# Patient Record
Sex: Male | Born: 1940 | Race: White | Hispanic: No | Marital: Married | Smoking: Never smoker
Health system: Southern US, Community
[De-identification: ages and names within clinical notes are randomized; demographics above are authoritative.]

## PROBLEM LIST (undated history)

## (undated) DIAGNOSIS — Z8619 Personal history of other infectious and parasitic diseases: Secondary | ICD-10-CM

## (undated) DIAGNOSIS — I700 Atherosclerosis of aorta: Secondary | ICD-10-CM

## (undated) DIAGNOSIS — E538 Deficiency of other specified B group vitamins: Secondary | ICD-10-CM

## (undated) DIAGNOSIS — H442B9 Degenerative myopia with macular hole, unspecified eye: Secondary | ICD-10-CM

## (undated) DIAGNOSIS — I10 Essential (primary) hypertension: Secondary | ICD-10-CM

## (undated) DIAGNOSIS — K8689 Other specified diseases of pancreas: Secondary | ICD-10-CM

## (undated) DIAGNOSIS — N400 Enlarged prostate without lower urinary tract symptoms: Secondary | ICD-10-CM

## (undated) DIAGNOSIS — E785 Hyperlipidemia, unspecified: Secondary | ICD-10-CM

## (undated) DIAGNOSIS — M1990 Unspecified osteoarthritis, unspecified site: Secondary | ICD-10-CM

## (undated) DIAGNOSIS — Z974 Presence of external hearing-aid: Secondary | ICD-10-CM

## (undated) DIAGNOSIS — G5600 Carpal tunnel syndrome, unspecified upper limb: Secondary | ICD-10-CM

## (undated) HISTORY — PX: HERNIA REPAIR: SHX51

## (undated) HISTORY — PX: CATARACT EXTRACTION: SUR2

## (undated) HISTORY — PX: GI PROSTATE BIOPSY: SUR644

---

## 2007-02-07 ENCOUNTER — Ambulatory Visit: Payer: Self-pay | Admitting: Gastroenterology

## 2011-08-29 ENCOUNTER — Ambulatory Visit: Payer: Self-pay | Admitting: Surgery

## 2012-03-03 DIAGNOSIS — R339 Retention of urine, unspecified: Secondary | ICD-10-CM | POA: Insufficient documentation

## 2012-03-03 DIAGNOSIS — R399 Unspecified symptoms and signs involving the genitourinary system: Secondary | ICD-10-CM | POA: Insufficient documentation

## 2012-03-03 DIAGNOSIS — R3911 Hesitancy of micturition: Secondary | ICD-10-CM | POA: Insufficient documentation

## 2012-03-03 DIAGNOSIS — R972 Elevated prostate specific antigen [PSA]: Secondary | ICD-10-CM | POA: Insufficient documentation

## 2012-03-03 DIAGNOSIS — K4090 Unilateral inguinal hernia, without obstruction or gangrene, not specified as recurrent: Secondary | ICD-10-CM | POA: Insufficient documentation

## 2012-03-03 DIAGNOSIS — N401 Enlarged prostate with lower urinary tract symptoms: Secondary | ICD-10-CM | POA: Insufficient documentation

## 2012-03-03 DIAGNOSIS — N529 Male erectile dysfunction, unspecified: Secondary | ICD-10-CM | POA: Insufficient documentation

## 2012-03-03 DIAGNOSIS — R6882 Decreased libido: Secondary | ICD-10-CM | POA: Insufficient documentation

## 2012-03-03 DIAGNOSIS — N411 Chronic prostatitis: Secondary | ICD-10-CM | POA: Insufficient documentation

## 2013-08-19 DIAGNOSIS — I10 Essential (primary) hypertension: Secondary | ICD-10-CM | POA: Insufficient documentation

## 2013-11-21 ENCOUNTER — Emergency Department: Payer: Self-pay | Admitting: Emergency Medicine

## 2014-09-05 NOTE — Op Note (Signed)
PATIENT NAME:  Randall Cole, Randall Cole MR#:  [id number] DATE OF BIRTH:  1940/06/19  DATE OF PROCEDURE:  08/29/2011  PREOPERATIVE DIAGNOSIS: Right inguinal hernia.   POSTOPERATIVE DIAGNOSIS: Right inguinal hernia.   PROCEDURE: Right inguinal hernia repair.   SURGEON: Rochel Brome, MD   ANESTHESIA: General.   INDICATIONS: This 74 year old male has recently been having bulging in the right groin. He was doing some strenuous activities about five weeks prior to presentation. He had some stinging sensation and subsequent minimal discomfort. A right inguinal hernia was demonstrated on physical exam and repair was recommended for definitive treatment.   DESCRIPTION OF PROCEDURE: The patient was placed on the operating table in the supine position under general anesthesia. The right groin was prepared with clippers and with ChloraPrep and draped in a sterile manner.   A right lower quadrant transversely oriented suprapubic incision was made and carried down through subcutaneous tissues. Several small bleeding points were cauterized. Scarpa's fascia was incised. The external oblique aponeurosis was incised along the course of its fibers to open the external ring and expose the inguinal cord structures. These structures were mobilized. A Penrose drain was passed around the cord structures. There was a direct inguinal hernia which consisted of markedly splayed fascia with the defect in the fascia. Next, the cremaster fibers were spread to expose an indirect hernia sac which was approximately 2.5 inches in length. The sac was dissected free from surrounding structures and followed up into the internal ring.  It was opened. Its continuity with the peritoneal cavity was demonstrated. A high ligation of the sac was done with 4-0 Vicryl suture ligature. The sac was excised. The stump was allowed to retract. It did not need pathological examination. Next, the floor of the inguinal canal was repaired with a row of 0 Surgilon  sutures beginning at the pubic tubercle, suturing the conjoined tendon to the shelving edge of the inguinal ligament, incorporating transversalis fascia into the repair. The last stitch led to satisfactory narrowing of the internal ring. Next, an onlay Atrium mesh was cut to create an oval shape of some 2.5 x 4 cm and was placed over the repair and was sutured to the repair with 0 Surgilon. It also was sutured medially to the fascia. A notch was cut out for the cord structures and straddled the cord structures, then sewn to the deep fascia. The repair looked good. Hemostasis was intact. Cord structures were replaced along the floor of the inguinal canal. The cut edges of the external oblique aponeurosis were closed with a running 4-0 Vicryl. The fascia superior and lateral to the repair site was infiltrated with 0.5% Sensorcaine with epinephrine. The subcuticular  tissues were infiltrated as well. The Scarpa's fascia was closed with interrupted 4-0 Vicryl. The skin was closed with running 5-0 Monocryl subcuticular suture and Dermabond. The testicle remained in the scrotum. The patient tolerated the procedure satisfactorily and was then prepared for transfer to the recovery room.    ____________________________ Lenna Sciara. Rochel Brome, MD [id number] D: 08/29/2011 08:50:25 ET T: 08/29/2011 10:51:31 ET JOB#: [id number]  cc: Loreli Dollar, MD, <Dictator> Loreli Dollar MD ELECTRONICALLY SIGNED 08/30/2011 13:03

## 2015-09-24 ENCOUNTER — Emergency Department: Payer: Commercial Managed Care - HMO

## 2015-09-24 ENCOUNTER — Encounter: Payer: Self-pay | Admitting: Emergency Medicine

## 2015-09-24 ENCOUNTER — Emergency Department
Admission: EM | Admit: 2015-09-24 | Discharge: 2015-09-24 | Disposition: A | Discharge status: Home / Self Care | Payer: Commercial Managed Care - HMO | Attending: Emergency Medicine | Admitting: Emergency Medicine

## 2015-09-24 DIAGNOSIS — R270 Ataxia, unspecified: Secondary | ICD-10-CM | POA: Diagnosis not present

## 2015-09-24 DIAGNOSIS — I10 Essential (primary) hypertension: Secondary | ICD-10-CM | POA: Insufficient documentation

## 2015-09-24 DIAGNOSIS — H6123 Impacted cerumen, bilateral: Secondary | ICD-10-CM | POA: Diagnosis present

## 2015-09-24 HISTORY — DX: Essential (primary) hypertension: I10

## 2015-09-24 LAB — CBC
HEMATOCRIT: 45.7 % (ref 40.0–52.0)
Hemoglobin: 15.9 g/dL (ref 13.0–18.0)
MCH: 32.6 pg (ref 26.0–34.0)
MCHC: 34.7 g/dL (ref 32.0–36.0)
MCV: 93.9 fL (ref 80.0–100.0)
PLATELETS: 224 10*3/uL (ref 150–440)
RBC: 4.87 MIL/uL (ref 4.40–5.90)
RDW: 13 % (ref 11.5–14.5)
WBC: 4.8 10*3/uL (ref 3.8–10.6)

## 2015-09-24 LAB — URINALYSIS COMPLETE WITH MICROSCOPIC (ARMC ONLY)
BILIRUBIN URINE: NEGATIVE
Bacteria, UA: NONE SEEN
GLUCOSE, UA: NEGATIVE mg/dL
HGB URINE DIPSTICK: NEGATIVE
KETONES UR: NEGATIVE mg/dL
LEUKOCYTES UA: NEGATIVE
Nitrite: NEGATIVE
PH: 6 (ref 5.0–8.0)
Protein, ur: NEGATIVE mg/dL
RBC / HPF: NONE SEEN RBC/hpf (ref 0–5)
Specific Gravity, Urine: 1.006 (ref 1.005–1.030)

## 2015-09-24 LAB — COMPREHENSIVE METABOLIC PANEL
ALT: 24 U/L (ref 17–63)
ANION GAP: 5 (ref 5–15)
AST: 26 U/L (ref 15–41)
Albumin: 4.2 g/dL (ref 3.5–5.0)
Alkaline Phosphatase: 65 U/L (ref 38–126)
BILIRUBIN TOTAL: 0.9 mg/dL (ref 0.3–1.2)
BUN: 15 mg/dL (ref 6–20)
CHLORIDE: 106 mmol/L (ref 101–111)
CO2: 29 mmol/L (ref 22–32)
Calcium: 9.3 mg/dL (ref 8.9–10.3)
Creatinine, Ser: 0.82 mg/dL (ref 0.61–1.24)
Glucose, Bld: 133 mg/dL — ABNORMAL HIGH (ref 65–99)
POTASSIUM: 4.5 mmol/L (ref 3.5–5.1)
Sodium: 140 mmol/L (ref 135–145)
TOTAL PROTEIN: 7.2 g/dL (ref 6.5–8.1)

## 2015-09-24 LAB — TROPONIN I

## 2015-09-24 MED ORDER — SODIUM CHLORIDE 0.9 % IV BOLUS (SEPSIS): 1000.0000 mL | Freq: Once | INTRAVENOUS | Status: DC

## 2015-09-24 MED ORDER — MECLIZINE HCL 25 MG PO TABS
25.0000 mg | ORAL_TABLET | Freq: Once | ORAL | Status: AC
  Administered 2015-09-24: 25 mg via ORAL
  Filled 2015-09-24: qty 1

## 2015-09-24 MED ORDER — MECLIZINE HCL 25 MG PO TABS: 25.0000 mg | ORAL_TABLET | Freq: Three times a day (TID) | ORAL | Status: DC | PRN

## 2015-09-24 NOTE — ED Notes (Signed)
Dr. Clearnce Hasten at bedside flushing patients ears. Per Dr. Clearnce Hasten hold off on IV fluids for now, patients heart rate has improved.

## 2015-09-24 NOTE — ED Notes (Signed)
Dizzy x 2 days. Denies chest pain or SOB. States usual heartrate in 50s.

## 2015-09-24 NOTE — Discharge Instructions (Signed)
Ataxia Ataxia is a condition that results in unsteadiness when walking and standing, poor coordination of body movements, and difficulty maintaining an upright posture. It occurs due to a problem with the part of your brain that controls coordination and stability (cerebellar dysfunction).  CAUSES  Ataxia can develop later in life (acquired ataxia) during your 20s to 30s, and even as late as into your 60s or beyond. Acquired ataxia may be caused by:  Changes in your nervous system (neurodegenerative).  Changes throughout your body (systemic disorders).  Excess exposure to:  Medicines, such as phenytoin and lithium.  Solvents.  Abuse of alcohol (alcoholism).  Medical conditions, such as:  Celiac sprue.  Hypothyroidism.  Vitamin E deficiency.  Structural brain abnormalities, such as tumors.  Multiple sclerosis.  Stroke.  Head injury. Ataxia may also be present early in life (non-acquired ataxia). There are two main types of non-acquired ataxia:  Cerebellar dysfunction present at birth (congenital).  Family inheritance (genetic heredity). Friedreich ataxia is the most common form of hereditary ataxia. SIGNS AND SYMPTOMS The signs and symptoms of ataxia can vary depending on how severe the condition is that causes it. Signs and symptoms may include:  Unsteadiness.  Walking with a wide stance.  Tremor.  Poorly coordinated body movements.  Difficulty maintaining a straight (upright) posture.  Fatigue.  Changes in your speech.  Changes in your vision.  Difficulty swallowing.  Difficulty with writing.  Decreased mental status (dementia).  Muscle spasms. DIAGNOSIS  Ataxia is diagnosed by discussing your personal and family history and through a physical exam. You may also have additional tests such as:  MRI.  Genetic testing. TREATMENT  Treatment for ataxia may include treating or removing the underlying condition causing the ataxia. Surgery may be  required if a structural abnormality in your brain is causing the ataxia. Otherwise, supportive treatments may be used to manage your symptoms. HOME CARE INSTRUCTIONS Monitor your ataxia for any changes. The following actions may help any discomfort you are experiencing:   Do not drink alcohol.  Lie down right away if you become very unsteady, dizzy, nauseated, or feel like you are going to faint. Wait until all of these feelings pass before you get up again. SEEK IMMEDIATE MEDICAL CARE IF:  Your unsteadiness suddenly worsens.  You develop severe headaches, chest pain, or abdominal pain.   You have weakness or numbness on one side of your body.   You have problems with your vision.   You feel confused.   You have difficulty speaking.   You have an irregular heartbeat or a very fast pulse.    This information is not intended to replace advice given to you by your health care provider. Make sure you discuss any questions you have with your health care provider.   Document Released: 11/25/2013 Document Reviewed: 11/25/2013 Elsevier Interactive Patient Education 2016 [place] Impaction The structures of the external ear canal secrete a waxy substance known as cerumen. Excess cerumen can build up in the ear canal, causing a condition known as cerumen impaction. Cerumen impaction can cause ear pain and disrupt the function of the ear. The rate of cerumen production differs for each individual. In certain individuals, the configuration of the ear canal may decrease his or her ability to naturally remove cerumen. CAUSES Cerumen impaction is caused by excessive cerumen production or buildup. RISK FACTORS  Frequent use of swabs to clean ears.  Having narrow ear canals.  Having eczema.  Being dehydrated. SIGNS AND SYMPTOMS  Diminished hearing.  Ear drainage.  Ear pain.  Ear itch. TREATMENT Treatment may involve:  Over-the-counter or prescription ear  drops to soften the cerumen.  Removal of cerumen by a health care provider. This may be done with:  Irrigation with warm water. This is the most common method of removal.  Ear curettes and other instruments.  Surgery. This may be done in severe cases. HOME CARE INSTRUCTIONS  Take medicines only as directed by your health care provider.  Do not insert objects into the ear with the intent of cleaning the ear. PREVENTION  Do not insert objects into the ear, even with the intent of cleaning the ear. Removing cerumen as a part of normal hygiene is not necessary, and the use of swabs in the ear canal is not recommended.  Drink enough water to keep your urine clear or pale yellow.  Control your eczema if you have it. SEEK MEDICAL CARE IF:  You develop ear pain.  You develop bleeding from the ear.  The cerumen does not clear after you use ear drops as directed.   This information is not intended to replace advice given to you by your health care provider. Make sure you discuss any questions you have with your health care provider.   Document Released: 06/07/2004 Document Revised: 05/21/2014 Document Reviewed: 12/15/2014 Elsevier Interactive Patient Education [employer].

## 2015-09-24 NOTE — ED Notes (Signed)
Lab results reviewed. Awaiting room for MD eval.  

## 2015-09-24 NOTE — ED Provider Notes (Addendum)
[facility] Emergency Department Provider Note   ____________________________________________  Time seen: Approximately 1230 PM  I have reviewed the triage vital signs and the nursing notes.   HISTORY  Chief Complaint Dizziness    HPI Randall Cole is a 75 y.o. male with a history of hypertension and cerumen impaction was presenting to the emergency department today with difficulty walking. He denies feeling dizzy or lightheaded. He says that over the past 2 days he has had the feeling of swelling to one side while walking. He says it is not one side that is worse than another. He denies any pain. Has been drinking less than normal lately because of his wife in the hospital. Also 2 days ago fell up on his blood pressure medication to compensate for missing several doses. He says that his heart rate is usually in the 50s but this morning in urgent care did down to the 30s. He says he has no feelings of the room moving or spinning while sitting and even when he stands up initially he is okay. He says that he has a history of cerumen impaction and has his ears cleaned out by Dr. Ladene Artist of otolaryngology but thinks he may not have this done in the past 2 years.He has no history of vertigo and has not experienced symptoms like this in the past. Denies any weakness or numbness.   Past Medical History  Diagnosis Date  . Hypertension     There are no active problems to display for this patient.   Past Surgical History  Procedure Laterality Date  . Hernia repair      No current outpatient prescriptions on file.  Allergies Statins  No family history on file.  Social History Social History  Substance Use Topics  . Smoking status: Never Smoker   . Smokeless tobacco: None  . Alcohol Use: Yes    Review of Systems Constitutional: No fever/chills Eyes: No visual changes. ENT: No sore throat. Cardiovascular: Denies chest pain. Respiratory: Denies  shortness of breath. Gastrointestinal: No abdominal pain.  No nausea, no vomiting.  No diarrhea.  No constipation. Genitourinary: Negative for dysuria. Musculoskeletal: Negative for back pain. Skin: Negative for rash. Neurological: Negative for headaches, focal weakness or numbness.  10-point ROS otherwise negative.  ____________________________________________   PHYSICAL EXAM:  VITAL SIGNS: ED Triage Vitals  Enc Vitals Group     BP 09/24/15 1010 174/75 mmHg     Pulse Rate 09/24/15 1010 49     Resp 09/24/15 1010 18     Temp 09/24/15 1010 97.5 F (36.4 C)     Temp Source 09/24/15 1010 Oral     SpO2 09/24/15 1010 100 %     Weight 09/24/15 1010 170 lb (77.111 kg)     Height 09/24/15 1010 5\' 10"  (1.778 m)     Head Cir --      Peak Flow --      Pain Score --      Pain Loc --      Pain Edu? --      Excl. in [place]? --     Constitutional: Alert and oriented. Well appearing and in no acute distress. Eyes: Conjunctivae are normal. Extraocular muscles are intact without any nystagmus. 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 Left pupil is round and reactive to light. Head: Atraumatic. Bilateral cerumen impactions Nose: No congestion/rhinnorhea. Mouth/Throat: Mucous membranes are moist.  Oropharynx non-erythematous. Neck: No stridor.   Cardiovascular:  Cardiac in the room from the high 40s.  However, resolves to the 60s when standing., regular rhythm. Grossly normal heart sounds.   Respiratory: Normal respiratory effort.  No retractions. Lungs CTAB. Gastrointestinal: Soft and nontender. No distention. No abdominal bruits. No CVA tenderness. Musculoskeletal: No lower extremity tenderness nor edema.  No joint effusions. Neurologic:  Normal speech and language. No gross focal neurologic deficits are appreciated. No gait instability when walk several steps at the bedside. Skin:  Skin is warm, dry and intact. No rash noted. Psychiatric: Mood and affect are normal.  Speech and behavior are normal.  ____________________________________________   LABS (all labs ordered are listed, but only abnormal results are displayed)  Labs Reviewed  COMPREHENSIVE METABOLIC PANEL - Abnormal; Notable for the following:    Glucose, Bld 133 (*)    All other components within normal limits  URINALYSIS COMPLETEWITH MICROSCOPIC ([facility] ONLY) - Abnormal; Notable for the following:    Color, Urine STRAW (*)    APPearance CLEAR (*)    Squamous Epithelial / LPF 0-5 (*)    All other components within normal limits  CBC  TROPONIN I   ____________________________________________  EKG  ED ECG REPORT I, Doran Stabler, the attending physician, personally viewed and interpreted this ECG.   Date: 09/24/2015  EKG Time: 1024  Rate: 47  Rhythm: sinus bradycardia  Axis: Normal axis  Intervals:Minimal discharge here for LVH.  ST&T Change: No ST segment elevation or depression. No abnormal T-wave inversion.  ____________________________________________  RADIOLOGY  CT Head Wo Contrast (Final result) Result time: 09/24/15 14:15:29   Final result by Rad Results In Interface (09/24/15 14:15:29)   Narrative:   CLINICAL DATA: Pt states has woke up last 2 days dizzy/off balance. Nausea yesterday, no other complaints.  EXAM: CT HEAD WITHOUT CONTRAST  TECHNIQUE: Contiguous axial images were obtained from the base of the skull through the vertex without intravenous contrast.  COMPARISON: None.  FINDINGS: Brain: Mild atrophy. No evidence of acute infarction, hemorrhage, extra-axial collection, ventriculomegaly, or mass effect.  Vascular: No hyperdense vessel or unexpected calcification.  Skull: Negative for fracture or focal lesion.  Sinuses/Orbits: No acute findings.  Other: None.  IMPRESSION: 1. Negative for bleed or other acute intracranial process.   Electronically Signed By: Lucrezia Europe M.D. On: 09/24/2015 14:15        ____________________________________________   PROCEDURES  Ceruminosis is noted.  Wax is removed by syringing and manual debridement. Instructions for home care to prevent wax buildup are given.  ____________________________________________   INITIAL IMPRESSION / ASSESSMENT AND PLAN / ED COURSE  Pertinent labs & imaging results that were available during my care of the patient were reviewed by me and considered in my medical decision making (see chart for details).  ----------------------------------------- 3:42 PM on 09/24/2015 -----------------------------------------  After cerumen disimpaction meclizine the patient is able to ambulate. He still has a mildly unsteady gait but says that he is much improved. I also discussed the case with Dr. Irish Elders of neurology who says that a neurological cause would likely be evident on CAT scan after several days of the symptoms. Likely will require several more doses of meclizine at home. Discussed the patient's also transient bradycardia. He is in the mid 43s in the room. He was not bradycardic after coming back from walking. His symptoms seem to be unrelated to the bradycardia which she has some degree of chronically. He is also concerned that Lyme disease because of 4 tick bites this year. However, he does  not have any fever, joint pain or rash. He does not have any heart block on his EKG. I discussed the outpatient plan with the patient as well as his daughter who will continue to try the meclizine. He'll be following up with her primary care doctor this week. If the patient has symptomatically improvement that it is likely that we resolves issue. However, if the symptoms do not continue to improve that it is likely a further workup will be merited including possible workup for Lyme. Patient and daughter understand this plan and are willing to comply. Will be discharged home. ____________________________________________   FINAL CLINICAL  IMPRESSION(S) / ED DIAGNOSES  Bilateral cerumen impaction. Ataxia.    NEW MEDICATIONS STARTED DURING THIS VISIT:  New Prescriptions   No medications on file     Note:  This document was prepared using Dragon voice recognition software and may include unintentional dictation errors.    Orbie Pyo, MD 09/24/15 1546  Patient continues to be concerned about his heart rate and borderline low blood pressure. I recommended cutting the evening dose of his Lopressor and a half to 50 mg and then following up with his primary care doctor for recheck this week. He is understanding of this plan and willing to comply.  Orbie Pyo, MD 09/24/15 [phone]

## 2015-09-27 ENCOUNTER — Other Ambulatory Visit: Payer: Self-pay | Admitting: Internal Medicine

## 2015-09-27 DIAGNOSIS — E782 Mixed hyperlipidemia: Secondary | ICD-10-CM | POA: Insufficient documentation

## 2015-09-27 DIAGNOSIS — I639 Cerebral infarction, unspecified: Secondary | ICD-10-CM

## 2015-09-30 ENCOUNTER — Ambulatory Visit
Admission: RE | Admit: 2015-09-30 | Discharge: 2015-09-30 | Disposition: A | Discharge status: Home / Self Care | Payer: Commercial Managed Care - HMO | Source: Ambulatory Visit | Attending: Internal Medicine | Admitting: Internal Medicine

## 2015-09-30 DIAGNOSIS — I639 Cerebral infarction, unspecified: Secondary | ICD-10-CM | POA: Insufficient documentation

## 2015-09-30 MED ORDER — GADOBENATE DIMEGLUMINE 529 MG/ML IV SOLN
20.0000 mL | Freq: Once | INTRAVENOUS | Status: AC | PRN
  Administered 2015-09-30: 16 mL via INTRAVENOUS

## 2015-10-03 DIAGNOSIS — G451 Carotid artery syndrome (hemispheric): Secondary | ICD-10-CM | POA: Insufficient documentation

## 2016-06-19 DIAGNOSIS — H902 Conductive hearing loss, unspecified: Secondary | ICD-10-CM | POA: Diagnosis not present

## 2016-06-19 DIAGNOSIS — H6123 Impacted cerumen, bilateral: Secondary | ICD-10-CM | POA: Diagnosis not present

## 2016-07-23 DIAGNOSIS — L918 Other hypertrophic disorders of the skin: Secondary | ICD-10-CM | POA: Diagnosis not present

## 2016-07-23 DIAGNOSIS — L570 Actinic keratosis: Secondary | ICD-10-CM | POA: Diagnosis not present

## 2016-07-23 DIAGNOSIS — L821 Other seborrheic keratosis: Secondary | ICD-10-CM | POA: Diagnosis not present

## 2016-07-23 DIAGNOSIS — L812 Freckles: Secondary | ICD-10-CM | POA: Diagnosis not present

## 2016-07-23 DIAGNOSIS — Z1283 Encounter for screening for malignant neoplasm of skin: Secondary | ICD-10-CM | POA: Diagnosis not present

## 2016-07-23 DIAGNOSIS — L578 Other skin changes due to chronic exposure to nonionizing radiation: Secondary | ICD-10-CM | POA: Diagnosis not present

## 2016-07-23 DIAGNOSIS — D229 Melanocytic nevi, unspecified: Secondary | ICD-10-CM | POA: Diagnosis not present

## 2016-07-23 DIAGNOSIS — D1800 Hemangioma unspecified site: Secondary | ICD-10-CM | POA: Diagnosis not present

## 2016-07-31 DIAGNOSIS — M5412 Radiculopathy, cervical region: Secondary | ICD-10-CM | POA: Diagnosis not present

## 2016-08-07 DIAGNOSIS — R739 Hyperglycemia, unspecified: Secondary | ICD-10-CM | POA: Diagnosis not present

## 2016-08-07 DIAGNOSIS — Z0000 Encounter for general adult medical examination without abnormal findings: Secondary | ICD-10-CM | POA: Diagnosis not present

## 2016-08-07 DIAGNOSIS — Z125 Encounter for screening for malignant neoplasm of prostate: Secondary | ICD-10-CM | POA: Diagnosis not present

## 2016-08-13 DIAGNOSIS — Z0000 Encounter for general adult medical examination without abnormal findings: Secondary | ICD-10-CM | POA: Diagnosis not present

## 2016-08-13 DIAGNOSIS — E538 Deficiency of other specified B group vitamins: Secondary | ICD-10-CM | POA: Diagnosis not present

## 2016-12-13 DIAGNOSIS — H90A32 Mixed conductive and sensorineural hearing loss, unilateral, left ear with restricted hearing on the contralateral side: Secondary | ICD-10-CM | POA: Diagnosis not present

## 2016-12-13 DIAGNOSIS — H90A21 Sensorineural hearing loss, unilateral, right ear, with restricted hearing on the contralateral side: Secondary | ICD-10-CM | POA: Diagnosis not present

## 2016-12-13 DIAGNOSIS — Z570 Occupational exposure to noise: Secondary | ICD-10-CM | POA: Diagnosis not present

## 2017-01-08 DIAGNOSIS — H903 Sensorineural hearing loss, bilateral: Secondary | ICD-10-CM | POA: Diagnosis not present

## 2017-03-20 DIAGNOSIS — L821 Other seborrheic keratosis: Secondary | ICD-10-CM | POA: Diagnosis not present

## 2017-03-20 DIAGNOSIS — L570 Actinic keratosis: Secondary | ICD-10-CM | POA: Diagnosis not present

## 2017-03-20 DIAGNOSIS — L578 Other skin changes due to chronic exposure to nonionizing radiation: Secondary | ICD-10-CM | POA: Diagnosis not present

## 2017-03-28 DIAGNOSIS — M9901 Segmental and somatic dysfunction of cervical region: Secondary | ICD-10-CM | POA: Diagnosis not present

## 2017-03-28 DIAGNOSIS — M546 Pain in thoracic spine: Secondary | ICD-10-CM | POA: Diagnosis not present

## 2017-03-28 DIAGNOSIS — M9902 Segmental and somatic dysfunction of thoracic region: Secondary | ICD-10-CM | POA: Diagnosis not present

## 2017-03-28 DIAGNOSIS — M5412 Radiculopathy, cervical region: Secondary | ICD-10-CM | POA: Diagnosis not present

## 2017-04-01 DIAGNOSIS — M5412 Radiculopathy, cervical region: Secondary | ICD-10-CM | POA: Diagnosis not present

## 2017-04-01 DIAGNOSIS — M9901 Segmental and somatic dysfunction of cervical region: Secondary | ICD-10-CM | POA: Diagnosis not present

## 2017-04-01 DIAGNOSIS — M546 Pain in thoracic spine: Secondary | ICD-10-CM | POA: Diagnosis not present

## 2017-04-01 DIAGNOSIS — M9902 Segmental and somatic dysfunction of thoracic region: Secondary | ICD-10-CM | POA: Diagnosis not present

## 2017-04-03 DIAGNOSIS — M5412 Radiculopathy, cervical region: Secondary | ICD-10-CM | POA: Diagnosis not present

## 2017-04-03 DIAGNOSIS — M9901 Segmental and somatic dysfunction of cervical region: Secondary | ICD-10-CM | POA: Diagnosis not present

## 2017-04-03 DIAGNOSIS — M546 Pain in thoracic spine: Secondary | ICD-10-CM | POA: Diagnosis not present

## 2017-04-03 DIAGNOSIS — M9902 Segmental and somatic dysfunction of thoracic region: Secondary | ICD-10-CM | POA: Diagnosis not present

## 2017-04-08 DIAGNOSIS — M5412 Radiculopathy, cervical region: Secondary | ICD-10-CM | POA: Diagnosis not present

## 2017-04-08 DIAGNOSIS — M546 Pain in thoracic spine: Secondary | ICD-10-CM | POA: Diagnosis not present

## 2017-04-08 DIAGNOSIS — M9901 Segmental and somatic dysfunction of cervical region: Secondary | ICD-10-CM | POA: Diagnosis not present

## 2017-04-08 DIAGNOSIS — M9902 Segmental and somatic dysfunction of thoracic region: Secondary | ICD-10-CM | POA: Diagnosis not present

## 2017-04-10 DIAGNOSIS — M9902 Segmental and somatic dysfunction of thoracic region: Secondary | ICD-10-CM | POA: Diagnosis not present

## 2017-04-10 DIAGNOSIS — M9901 Segmental and somatic dysfunction of cervical region: Secondary | ICD-10-CM | POA: Diagnosis not present

## 2017-04-10 DIAGNOSIS — M5412 Radiculopathy, cervical region: Secondary | ICD-10-CM | POA: Diagnosis not present

## 2017-04-10 DIAGNOSIS — M546 Pain in thoracic spine: Secondary | ICD-10-CM | POA: Diagnosis not present

## 2017-04-12 DIAGNOSIS — M9902 Segmental and somatic dysfunction of thoracic region: Secondary | ICD-10-CM | POA: Diagnosis not present

## 2017-04-12 DIAGNOSIS — M9901 Segmental and somatic dysfunction of cervical region: Secondary | ICD-10-CM | POA: Diagnosis not present

## 2017-04-12 DIAGNOSIS — M5412 Radiculopathy, cervical region: Secondary | ICD-10-CM | POA: Diagnosis not present

## 2017-04-12 DIAGNOSIS — M546 Pain in thoracic spine: Secondary | ICD-10-CM | POA: Diagnosis not present

## 2017-04-15 DIAGNOSIS — M5412 Radiculopathy, cervical region: Secondary | ICD-10-CM | POA: Diagnosis not present

## 2017-04-15 DIAGNOSIS — M9902 Segmental and somatic dysfunction of thoracic region: Secondary | ICD-10-CM | POA: Diagnosis not present

## 2017-04-15 DIAGNOSIS — M546 Pain in thoracic spine: Secondary | ICD-10-CM | POA: Diagnosis not present

## 2017-04-15 DIAGNOSIS — M9901 Segmental and somatic dysfunction of cervical region: Secondary | ICD-10-CM | POA: Diagnosis not present

## 2017-04-24 DIAGNOSIS — M9901 Segmental and somatic dysfunction of cervical region: Secondary | ICD-10-CM | POA: Diagnosis not present

## 2017-04-24 DIAGNOSIS — M546 Pain in thoracic spine: Secondary | ICD-10-CM | POA: Diagnosis not present

## 2017-04-24 DIAGNOSIS — M9902 Segmental and somatic dysfunction of thoracic region: Secondary | ICD-10-CM | POA: Diagnosis not present

## 2017-04-24 DIAGNOSIS — M5412 Radiculopathy, cervical region: Secondary | ICD-10-CM | POA: Diagnosis not present

## 2017-05-01 DIAGNOSIS — M9902 Segmental and somatic dysfunction of thoracic region: Secondary | ICD-10-CM | POA: Diagnosis not present

## 2017-05-01 DIAGNOSIS — M546 Pain in thoracic spine: Secondary | ICD-10-CM | POA: Diagnosis not present

## 2017-05-01 DIAGNOSIS — M9901 Segmental and somatic dysfunction of cervical region: Secondary | ICD-10-CM | POA: Diagnosis not present

## 2017-05-01 DIAGNOSIS — M5412 Radiculopathy, cervical region: Secondary | ICD-10-CM | POA: Diagnosis not present

## 2017-05-10 DIAGNOSIS — M9901 Segmental and somatic dysfunction of cervical region: Secondary | ICD-10-CM | POA: Diagnosis not present

## 2017-05-10 DIAGNOSIS — M546 Pain in thoracic spine: Secondary | ICD-10-CM | POA: Diagnosis not present

## 2017-05-10 DIAGNOSIS — M5412 Radiculopathy, cervical region: Secondary | ICD-10-CM | POA: Diagnosis not present

## 2017-05-10 DIAGNOSIS — M9902 Segmental and somatic dysfunction of thoracic region: Secondary | ICD-10-CM | POA: Diagnosis not present

## 2017-05-20 DIAGNOSIS — S0083XA Contusion of other part of head, initial encounter: Secondary | ICD-10-CM | POA: Diagnosis not present

## 2017-05-24 DIAGNOSIS — Z961 Presence of intraocular lens: Secondary | ICD-10-CM | POA: Diagnosis not present

## 2017-05-28 DIAGNOSIS — H52223 Regular astigmatism, bilateral: Secondary | ICD-10-CM | POA: Diagnosis not present

## 2017-08-20 DIAGNOSIS — E538 Deficiency of other specified B group vitamins: Secondary | ICD-10-CM | POA: Diagnosis not present

## 2017-08-20 DIAGNOSIS — Z125 Encounter for screening for malignant neoplasm of prostate: Secondary | ICD-10-CM | POA: Diagnosis not present

## 2017-08-20 DIAGNOSIS — Z0000 Encounter for general adult medical examination without abnormal findings: Secondary | ICD-10-CM | POA: Diagnosis not present

## 2017-08-26 DIAGNOSIS — Z0000 Encounter for general adult medical examination without abnormal findings: Secondary | ICD-10-CM | POA: Diagnosis not present

## 2017-08-26 DIAGNOSIS — Z79899 Other long term (current) drug therapy: Secondary | ICD-10-CM | POA: Diagnosis not present

## 2017-08-26 DIAGNOSIS — R739 Hyperglycemia, unspecified: Secondary | ICD-10-CM | POA: Diagnosis not present

## 2017-08-26 DIAGNOSIS — E538 Deficiency of other specified B group vitamins: Secondary | ICD-10-CM | POA: Diagnosis not present

## 2017-08-26 DIAGNOSIS — E782 Mixed hyperlipidemia: Secondary | ICD-10-CM | POA: Diagnosis not present

## 2017-08-26 DIAGNOSIS — Z125 Encounter for screening for malignant neoplasm of prostate: Secondary | ICD-10-CM | POA: Diagnosis not present

## 2017-09-04 DIAGNOSIS — Z0000 Encounter for general adult medical examination without abnormal findings: Secondary | ICD-10-CM | POA: Diagnosis not present

## 2017-12-16 DIAGNOSIS — M25512 Pain in left shoulder: Secondary | ICD-10-CM | POA: Diagnosis not present

## 2017-12-16 DIAGNOSIS — M5412 Radiculopathy, cervical region: Secondary | ICD-10-CM | POA: Diagnosis not present

## 2017-12-16 DIAGNOSIS — M4802 Spinal stenosis, cervical region: Secondary | ICD-10-CM | POA: Diagnosis not present

## 2017-12-16 DIAGNOSIS — M50223 Other cervical disc displacement at C6-C7 level: Secondary | ICD-10-CM | POA: Diagnosis not present

## 2017-12-30 DIAGNOSIS — R739 Hyperglycemia, unspecified: Secondary | ICD-10-CM | POA: Diagnosis not present

## 2017-12-30 DIAGNOSIS — Z79899 Other long term (current) drug therapy: Secondary | ICD-10-CM | POA: Diagnosis not present

## 2018-02-12 DIAGNOSIS — M5010 Cervical disc disorder with radiculopathy, unspecified cervical region: Secondary | ICD-10-CM | POA: Diagnosis not present

## 2018-02-12 DIAGNOSIS — M5412 Radiculopathy, cervical region: Secondary | ICD-10-CM | POA: Diagnosis not present

## 2018-02-13 ENCOUNTER — Other Ambulatory Visit: Payer: Self-pay | Admitting: Internal Medicine

## 2018-02-13 DIAGNOSIS — M5412 Radiculopathy, cervical region: Secondary | ICD-10-CM

## 2018-03-19 DIAGNOSIS — L578 Other skin changes due to chronic exposure to nonionizing radiation: Secondary | ICD-10-CM | POA: Diagnosis not present

## 2018-03-19 DIAGNOSIS — L601 Onycholysis: Secondary | ICD-10-CM | POA: Diagnosis not present

## 2018-03-19 DIAGNOSIS — L570 Actinic keratosis: Secondary | ICD-10-CM | POA: Diagnosis not present

## 2018-03-19 DIAGNOSIS — L820 Inflamed seborrheic keratosis: Secondary | ICD-10-CM | POA: Diagnosis not present

## 2018-03-24 ENCOUNTER — Ambulatory Visit (HOSPITAL_COMMUNITY)
Admission: RE | Admit: 2018-03-24 | Discharge: 2018-03-24 | Disposition: A | Discharge status: Home / Self Care | Payer: PPO | Source: Ambulatory Visit | Attending: Internal Medicine | Admitting: Internal Medicine

## 2018-03-24 DIAGNOSIS — M4722 Other spondylosis with radiculopathy, cervical region: Secondary | ICD-10-CM | POA: Diagnosis not present

## 2018-03-24 DIAGNOSIS — M5412 Radiculopathy, cervical region: Secondary | ICD-10-CM

## 2018-03-24 DIAGNOSIS — M4802 Spinal stenosis, cervical region: Secondary | ICD-10-CM | POA: Diagnosis not present

## 2018-03-24 DIAGNOSIS — M4322 Fusion of spine, cervical region: Secondary | ICD-10-CM | POA: Insufficient documentation

## 2018-03-24 DIAGNOSIS — M129 Arthropathy, unspecified: Secondary | ICD-10-CM | POA: Insufficient documentation

## 2018-03-24 DIAGNOSIS — M542 Cervicalgia: Secondary | ICD-10-CM | POA: Diagnosis not present

## 2018-03-25 DIAGNOSIS — R739 Hyperglycemia, unspecified: Secondary | ICD-10-CM | POA: Diagnosis not present

## 2018-03-26 DIAGNOSIS — M4802 Spinal stenosis, cervical region: Secondary | ICD-10-CM | POA: Diagnosis not present

## 2018-03-26 DIAGNOSIS — M5010 Cervical disc disorder with radiculopathy, unspecified cervical region: Secondary | ICD-10-CM | POA: Diagnosis not present

## 2018-03-26 DIAGNOSIS — M659 Synovitis and tenosynovitis, unspecified: Secondary | ICD-10-CM | POA: Diagnosis not present

## 2018-03-28 IMAGING — MR MR HEAD WO/W CM
12 series · 48 of 48 positions shown · IV contrast (16 ML MULTIHANCE)
Comparison: Head CT from 6 days ago

CLINICAL DATA: Acute CVA.  Dizziness and lightheadedness.

EXAM:
MRI HEAD WITHOUT AND WITH CONTRAST
TECHNIQUE: Multiplanar, multiecho pulse sequences of the brain and surrounding
structures were obtained without and with intravenous contrast.
CONTRAST:  16mL MULTIHANCE GADOBENATE DIMEGLUMINE 529 MG/ML IV SOLN

[Series 2: T1 · sagittal · 5.0mm · 0.45mm/px · 3 of 27 slices shown (1 of 2)]
[im 1/27]
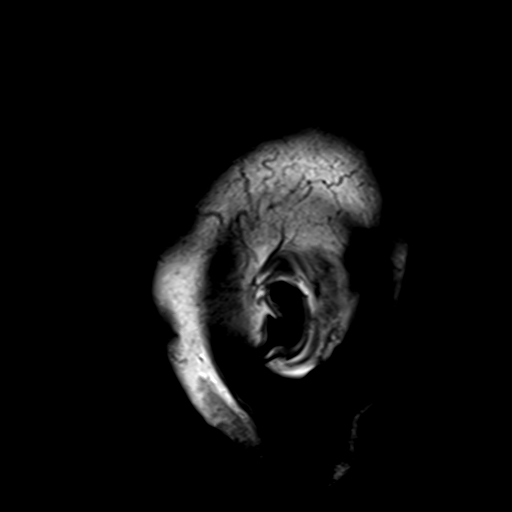
[im 14/27]
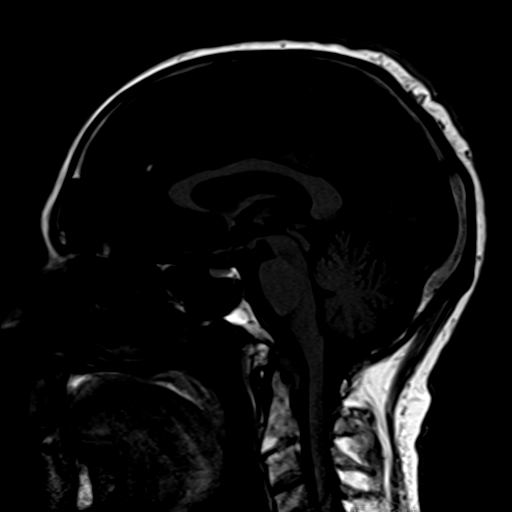
[im 27/27]
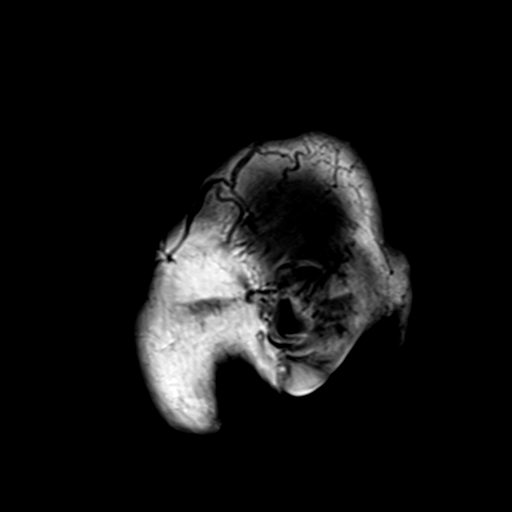

[Series 4: DWI · axial · 3.0mm · 1.80mm/px · z∈[-46,+115]mm · 5 of 54 slices shown (1 of 4)]
[im 1/54]
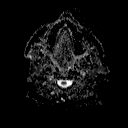
[im 14/54]
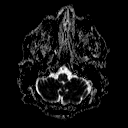
[im 27/54]
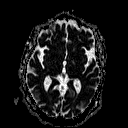
[im 40/54]
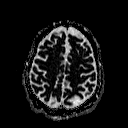
[im 54/54]
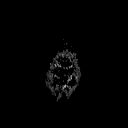

[Series 6: DWI · coronal · 3.0mm · 1.80mm/px · 4 of 45 slices shown (2 of 4)]
[im 1/45]
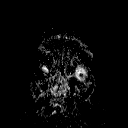
[im 15/45]
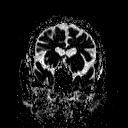
[im 30/45]
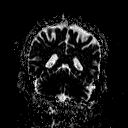
[im 45/45]
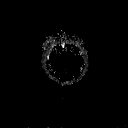

[Series 7: T2 · axial · 5.0mm · 0.60mm/px · z∈[-51,+117]mm · 3 of 27 slices shown (1 of 2)]
[im 1/27]
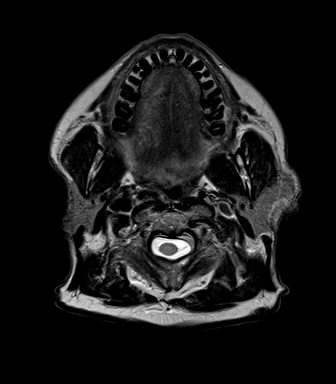
[im 14/27]
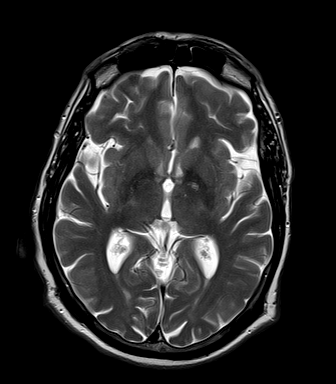
[im 27/27]
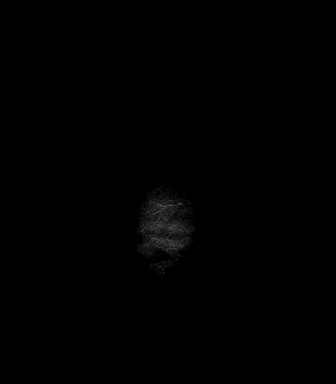

[Series 8: FLAIR · axial · 5.0mm · 0.45mm/px · z∈[-51,+117]mm · 3 of 27 slices shown]
[im 1/27]
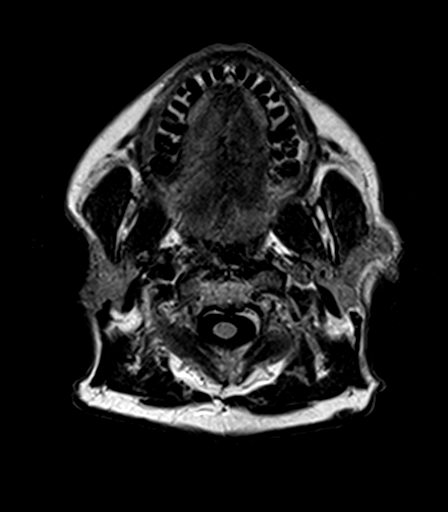
[im 14/27]
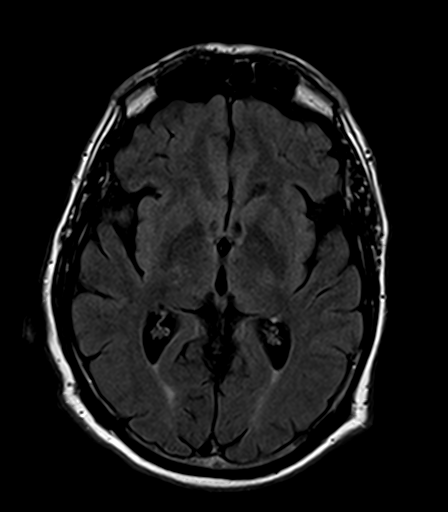
[im 27/27]
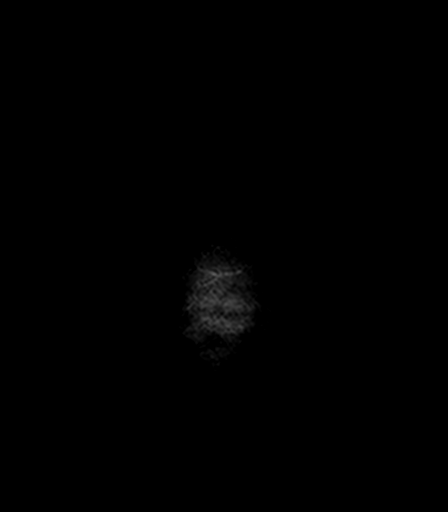

[Series 9: T2 · axial · 5.0mm · 0.45mm/px · z∈[-51,+117]mm · 3 of 27 slices shown (2 of 2)]
[im 1/27]
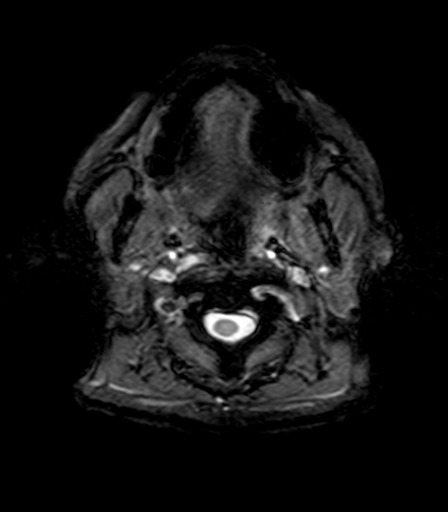
[im 14/27]
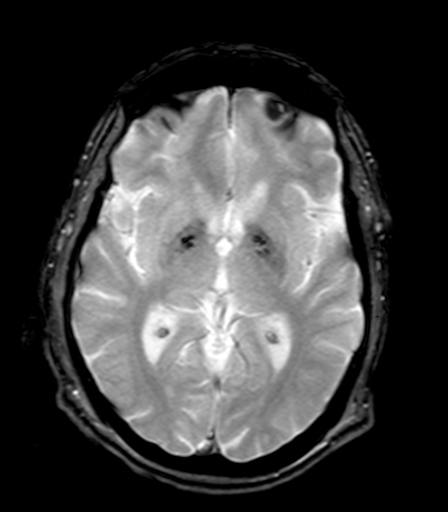
[im 27/27]
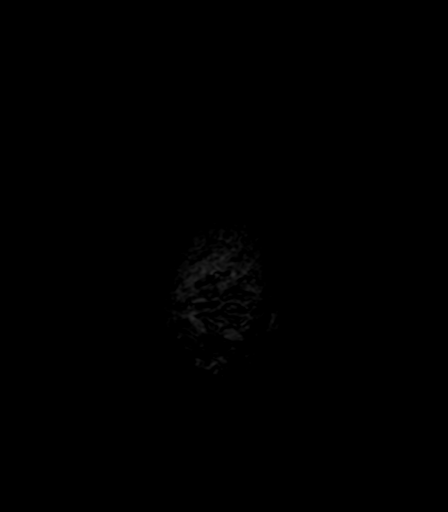

[Series 10: T1 · axial · 3.0mm · 1.00mm/px · z∈[-54,+122]mm · 6 of 60 slices shown (2 of 2)]
[im 1/60]
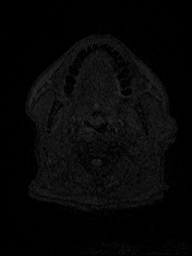
[im 12/60]
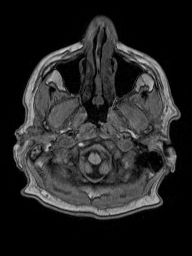
[im 24/60]
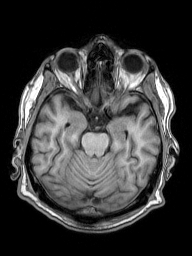
[im 36/60]
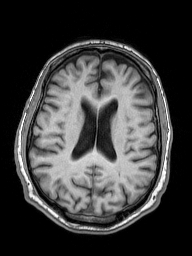
[im 48/60]
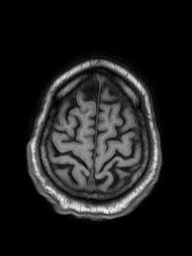
[im 60/60]
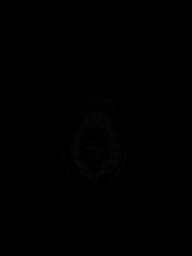

[Series 11: T2 post-contrast · coronal · 5.0mm · 0.49mm/px · 3 of 27 slices shown]
[im 1/27]
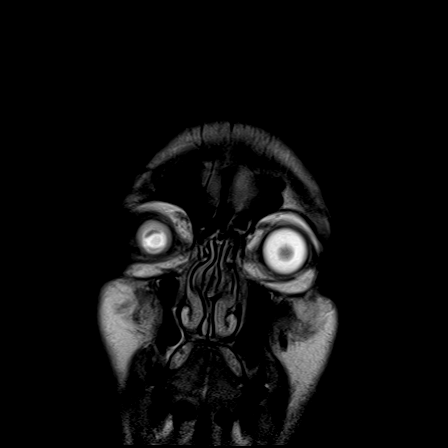
[im 14/27]
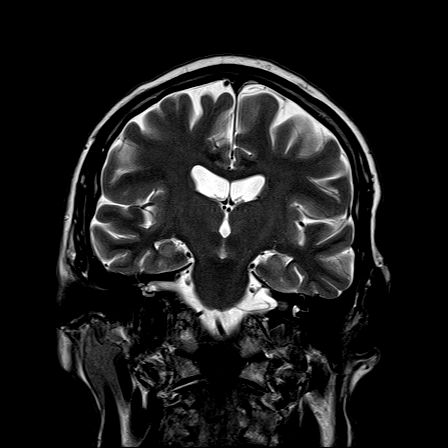
[im 27/27]
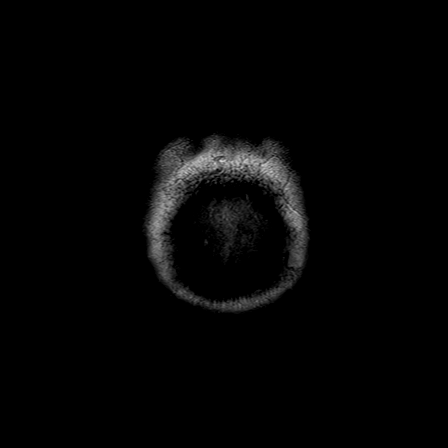

[Series 12: T1 post-contrast · axial · 3.0mm · 1.00mm/px · z∈[-54,+122]mm · 6 of 60 slices shown (1 of 2)]
[im 1/60]
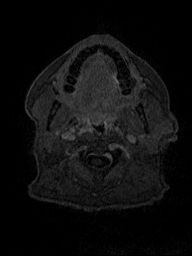
[im 12/60]
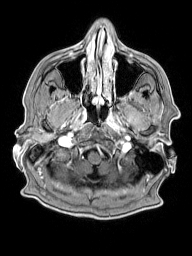
[im 24/60]
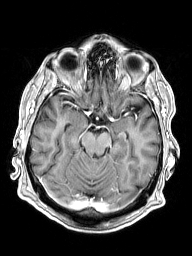
[im 36/60]
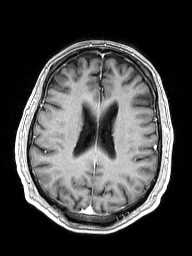
[im 48/60]
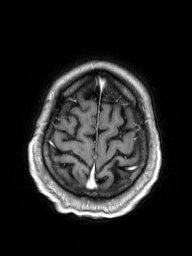
[im 60/60]
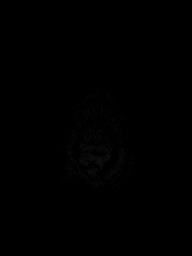

[Series 13: T1 post-contrast · coronal · 5.0mm · 0.43mm/px · 3 of 27 slices shown (2 of 2)]
[im 1/27]
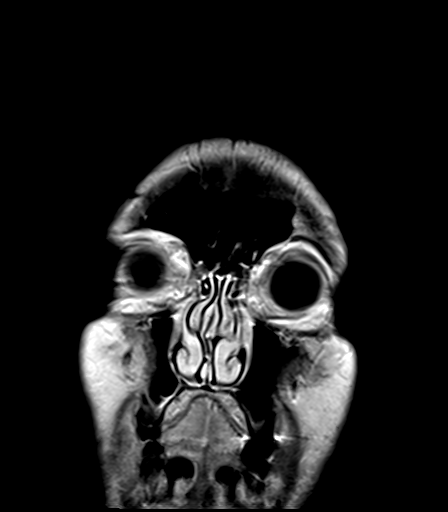
[im 14/27]
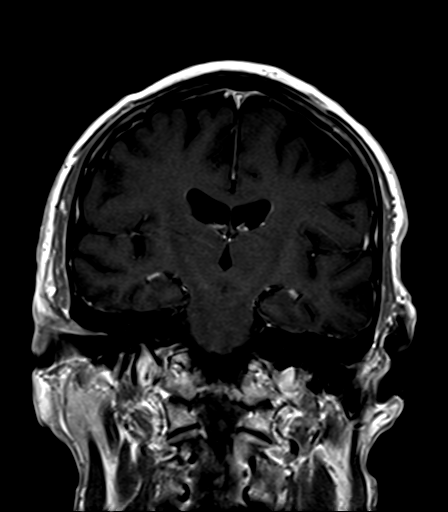
[im 27/27]
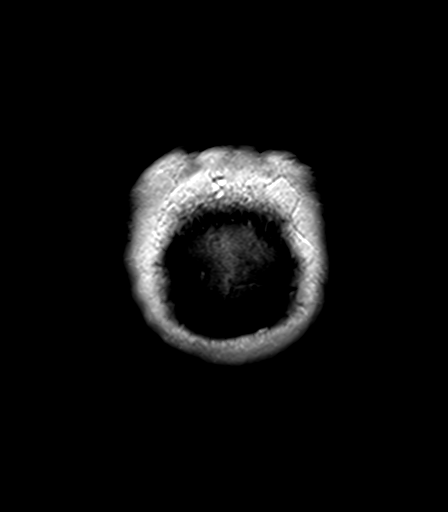

[Series 100: DWI · axial · 3.0mm · 1.80mm/px · z∈[-46,+115]mm · 5 of 54 slices shown (3 of 4)]
[im 1/54]
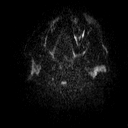
[im 14/54]
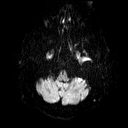
[im 27/54]
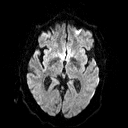
[im 40/54]
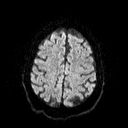
[im 54/54]
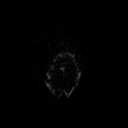

[Series 101: DWI · coronal · 3.0mm · 1.80mm/px · 4 of 45 slices shown (4 of 4)]
[im 1/45]
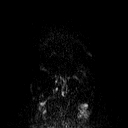
[im 15/45]
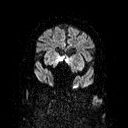
[im 30/45]
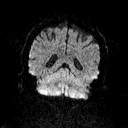
[im 45/45]
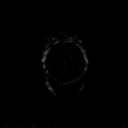

[48 of 48 positions shown; findings below may reference images not displayed]

FINDINGS: Calvarium and upper cervical spine: No focal marrow signal
abnormality. Prominent transverse ligamentous thickening without
foramen magnum stenosis or convincing erosion.

Orbits: Negative.

Sinuses and Mastoids: Small mucous retention cyst in the inferior
left maxillary antrum.

Brain: No acute or remote infarct, hemorrhage, hydrocephalus, or
mass lesion. No evidence of large vessel occlusion. Age congruent
cerebral volume and white matter appearance. No abnormal
intracranial enhancement.
IMPRESSION: No acute finding, including infarct.  Unremarkable brain MRI.

## 2018-04-03 DIAGNOSIS — S4991XA Unspecified injury of right shoulder and upper arm, initial encounter: Secondary | ICD-10-CM | POA: Diagnosis not present

## 2018-04-03 DIAGNOSIS — M5412 Radiculopathy, cervical region: Secondary | ICD-10-CM | POA: Diagnosis not present

## 2018-04-24 DIAGNOSIS — M5412 Radiculopathy, cervical region: Secondary | ICD-10-CM | POA: Diagnosis not present

## 2018-04-24 DIAGNOSIS — M25511 Pain in right shoulder: Secondary | ICD-10-CM | POA: Diagnosis not present

## 2018-04-24 DIAGNOSIS — R29898 Other symptoms and signs involving the musculoskeletal system: Secondary | ICD-10-CM | POA: Diagnosis not present

## 2018-05-08 DIAGNOSIS — M5412 Radiculopathy, cervical region: Secondary | ICD-10-CM | POA: Diagnosis not present

## 2018-05-19 DIAGNOSIS — M5412 Radiculopathy, cervical region: Secondary | ICD-10-CM | POA: Diagnosis not present

## 2018-05-26 DIAGNOSIS — R1011 Right upper quadrant pain: Secondary | ICD-10-CM | POA: Diagnosis not present

## 2018-05-26 DIAGNOSIS — M19011 Primary osteoarthritis, right shoulder: Secondary | ICD-10-CM | POA: Diagnosis not present

## 2018-05-26 DIAGNOSIS — M5412 Radiculopathy, cervical region: Secondary | ICD-10-CM | POA: Diagnosis not present

## 2018-05-26 DIAGNOSIS — M65811 Other synovitis and tenosynovitis, right shoulder: Secondary | ICD-10-CM | POA: Diagnosis not present

## 2018-06-02 ENCOUNTER — Other Ambulatory Visit: Payer: Self-pay | Admitting: Internal Medicine

## 2018-06-02 ENCOUNTER — Other Ambulatory Visit (HOSPITAL_COMMUNITY): Payer: Self-pay | Admitting: Internal Medicine

## 2018-06-02 DIAGNOSIS — M5412 Radiculopathy, cervical region: Secondary | ICD-10-CM

## 2018-06-02 DIAGNOSIS — R1011 Right upper quadrant pain: Secondary | ICD-10-CM

## 2018-06-06 ENCOUNTER — Ambulatory Visit
Admission: RE | Admit: 2018-06-06 | Discharge: 2018-06-06 | Disposition: A | Discharge status: Home / Self Care | Payer: PPO | Source: Ambulatory Visit | Attending: Internal Medicine | Admitting: Internal Medicine

## 2018-06-06 DIAGNOSIS — R1011 Right upper quadrant pain: Secondary | ICD-10-CM | POA: Diagnosis not present

## 2018-06-06 DIAGNOSIS — M5412 Radiculopathy, cervical region: Secondary | ICD-10-CM | POA: Insufficient documentation

## 2018-06-16 DIAGNOSIS — M5412 Radiculopathy, cervical region: Secondary | ICD-10-CM | POA: Diagnosis not present

## 2018-07-04 DIAGNOSIS — J40 Bronchitis, not specified as acute or chronic: Secondary | ICD-10-CM | POA: Diagnosis not present

## 2018-10-15 DIAGNOSIS — R739 Hyperglycemia, unspecified: Secondary | ICD-10-CM | POA: Diagnosis not present

## 2018-10-15 DIAGNOSIS — Z125 Encounter for screening for malignant neoplasm of prostate: Secondary | ICD-10-CM | POA: Diagnosis not present

## 2018-10-15 DIAGNOSIS — E782 Mixed hyperlipidemia: Secondary | ICD-10-CM | POA: Diagnosis not present

## 2018-10-15 DIAGNOSIS — E538 Deficiency of other specified B group vitamins: Secondary | ICD-10-CM | POA: Diagnosis not present

## 2018-10-22 DIAGNOSIS — E538 Deficiency of other specified B group vitamins: Secondary | ICD-10-CM | POA: Diagnosis not present

## 2018-10-22 DIAGNOSIS — Z0000 Encounter for general adult medical examination without abnormal findings: Secondary | ICD-10-CM | POA: Diagnosis not present

## 2018-10-22 DIAGNOSIS — R739 Hyperglycemia, unspecified: Secondary | ICD-10-CM | POA: Diagnosis not present

## 2018-10-22 DIAGNOSIS — E782 Mixed hyperlipidemia: Secondary | ICD-10-CM | POA: Diagnosis not present

## 2018-10-22 DIAGNOSIS — R1011 Right upper quadrant pain: Secondary | ICD-10-CM | POA: Diagnosis not present

## 2018-10-22 DIAGNOSIS — Z125 Encounter for screening for malignant neoplasm of prostate: Secondary | ICD-10-CM | POA: Diagnosis not present

## 2018-10-24 ENCOUNTER — Other Ambulatory Visit: Payer: Self-pay | Admitting: Internal Medicine

## 2018-10-24 DIAGNOSIS — R1011 Right upper quadrant pain: Secondary | ICD-10-CM

## 2018-10-29 DIAGNOSIS — Z1211 Encounter for screening for malignant neoplasm of colon: Secondary | ICD-10-CM | POA: Diagnosis not present

## 2018-10-29 DIAGNOSIS — Z1212 Encounter for screening for malignant neoplasm of rectum: Secondary | ICD-10-CM | POA: Diagnosis not present

## 2018-11-10 ENCOUNTER — Other Ambulatory Visit: Payer: Self-pay

## 2018-11-10 ENCOUNTER — Ambulatory Visit
Admission: RE | Admit: 2018-11-10 | Discharge: 2018-11-10 | Disposition: A | Discharge status: Home / Self Care | Payer: PPO | Source: Ambulatory Visit | Attending: Internal Medicine | Admitting: Internal Medicine

## 2018-11-10 DIAGNOSIS — R1011 Right upper quadrant pain: Secondary | ICD-10-CM

## 2018-11-10 DIAGNOSIS — R102 Pelvic and perineal pain: Secondary | ICD-10-CM | POA: Diagnosis not present

## 2018-11-10 DIAGNOSIS — K828 Other specified diseases of gallbladder: Secondary | ICD-10-CM | POA: Diagnosis not present

## 2018-11-10 MED ORDER — TECHNETIUM TC 99M MEBROFENIN IV KIT
5.2950 | PACK | Freq: Once | INTRAVENOUS | Status: AC | PRN
  Administered 2018-11-10: 5.295 via INTRAVENOUS

## 2018-12-25 DIAGNOSIS — C4442 Squamous cell carcinoma of skin of scalp and neck: Secondary | ICD-10-CM | POA: Diagnosis not present

## 2018-12-25 DIAGNOSIS — L814 Other melanin hyperpigmentation: Secondary | ICD-10-CM | POA: Diagnosis not present

## 2018-12-25 DIAGNOSIS — L570 Actinic keratosis: Secondary | ICD-10-CM | POA: Diagnosis not present

## 2018-12-25 DIAGNOSIS — D2230 Melanocytic nevi of unspecified part of face: Secondary | ICD-10-CM | POA: Diagnosis not present

## 2018-12-25 DIAGNOSIS — D229 Melanocytic nevi, unspecified: Secondary | ICD-10-CM | POA: Diagnosis not present

## 2018-12-25 DIAGNOSIS — L821 Other seborrheic keratosis: Secondary | ICD-10-CM | POA: Diagnosis not present

## 2018-12-25 DIAGNOSIS — Z1283 Encounter for screening for malignant neoplasm of skin: Secondary | ICD-10-CM | POA: Diagnosis not present

## 2018-12-25 DIAGNOSIS — D225 Melanocytic nevi of trunk: Secondary | ICD-10-CM | POA: Diagnosis not present

## 2018-12-25 DIAGNOSIS — L578 Other skin changes due to chronic exposure to nonionizing radiation: Secondary | ICD-10-CM | POA: Diagnosis not present

## 2018-12-25 DIAGNOSIS — C4492 Squamous cell carcinoma of skin, unspecified: Secondary | ICD-10-CM

## 2018-12-25 HISTORY — DX: Squamous cell carcinoma of skin, unspecified: C44.92

## 2019-03-16 DIAGNOSIS — H2512 Age-related nuclear cataract, left eye: Secondary | ICD-10-CM | POA: Diagnosis not present

## 2019-08-18 DIAGNOSIS — G5603 Carpal tunnel syndrome, bilateral upper limbs: Secondary | ICD-10-CM | POA: Diagnosis not present

## 2019-08-18 DIAGNOSIS — R1084 Generalized abdominal pain: Secondary | ICD-10-CM | POA: Diagnosis not present

## 2019-09-02 DIAGNOSIS — H353132 Nonexudative age-related macular degeneration, bilateral, intermediate dry stage: Secondary | ICD-10-CM | POA: Diagnosis not present

## 2019-09-14 DIAGNOSIS — R1012 Left upper quadrant pain: Secondary | ICD-10-CM | POA: Diagnosis not present

## 2019-09-14 DIAGNOSIS — Z0000 Encounter for general adult medical examination without abnormal findings: Secondary | ICD-10-CM | POA: Diagnosis not present

## 2019-09-14 DIAGNOSIS — I10 Essential (primary) hypertension: Secondary | ICD-10-CM | POA: Diagnosis not present

## 2019-09-21 ENCOUNTER — Other Ambulatory Visit: Payer: Self-pay

## 2019-09-21 ENCOUNTER — Ambulatory Visit: Payer: PPO | Admitting: Dermatology

## 2019-09-21 DIAGNOSIS — L821 Other seborrheic keratosis: Secondary | ICD-10-CM

## 2019-09-21 DIAGNOSIS — L570 Actinic keratosis: Secondary | ICD-10-CM | POA: Diagnosis not present

## 2019-09-21 DIAGNOSIS — L820 Inflamed seborrheic keratosis: Secondary | ICD-10-CM

## 2019-09-21 DIAGNOSIS — Z85828 Personal history of other malignant neoplasm of skin: Secondary | ICD-10-CM | POA: Diagnosis not present

## 2019-09-21 DIAGNOSIS — Z1283 Encounter for screening for malignant neoplasm of skin: Secondary | ICD-10-CM

## 2019-09-21 DIAGNOSIS — L578 Other skin changes due to chronic exposure to nonionizing radiation: Secondary | ICD-10-CM

## 2019-09-21 DIAGNOSIS — D1800 Hemangioma unspecified site: Secondary | ICD-10-CM

## 2019-09-21 DIAGNOSIS — B353 Tinea pedis: Secondary | ICD-10-CM

## 2019-09-21 DIAGNOSIS — D229 Melanocytic nevi, unspecified: Secondary | ICD-10-CM

## 2019-09-21 DIAGNOSIS — L814 Other melanin hyperpigmentation: Secondary | ICD-10-CM

## 2019-09-21 MED ORDER — TERBINAFINE HCL 250 MG PO TABS: 250.0000 mg | ORAL_TABLET | Freq: Every day | ORAL | 0 refills | Status: DC

## 2019-09-21 NOTE — Progress Notes (Signed)
Follow-Up Visit   Subjective  Randall Cole is a 79 y.o. male who presents for the following: TBSE (has some areas on back. ), Hx of SCC (Mid vertex scalp with [facility] 12/25/18), and Nail Problem (has been there for some time.). Patient presents for total body skin exam for skin cancer screening and mole check.  Skin cancer screening performed today.   The following portions of the chart were reviewed this encounter and updated as appropriate:  Allergies  Meds  Problems  Med Hx  Surg Hx  Fam Hx      Review of Systems:  No other skin or systemic complaints except as noted in HPI or Assessment and Plan.  Objective  Well appearing patient in no apparent distress; mood and affect are within normal limits.  A full examination was performed including scalp, head, eyes, ears, nose, lips, neck, chest, axillae, abdomen, back, buttocks, bilateral upper extremities, bilateral lower extremities, hands, feet, fingers, toes, fingernails, and toenails. All findings within normal limits unless otherwise noted below.  Objective  Scalp and Face x 12 (12): Erythematous thin papules/macules with gritty scale.   Objective  Mid vertex scalp: Well healed scar with no evidence of recurrence, no lymphadenopathy.   Objective  Left Chest: Erythematous keratotic or waxy stuck-on papule or plaque.   Objective  toenails on bilateral feet: Scaling and maceration web spaces and over distal and lateral soles.    Assessment & Plan  AK (actinic keratosis) (12) Scalp and Face x 12  Recommend PDT treatment for topical field treatment creams for PreCancers of skin"   Destruction of lesion - Scalp and Face x 12 Complexity: simple   Destruction method: cryotherapy   Informed consent: discussed and consent obtained   Timeout:  patient name, date of birth, surgical site, and procedure verified Lesion destroyed using liquid nitrogen: Yes   Region frozen until ice ball extended beyond lesion: Yes     Outcome: patient tolerated procedure well with no complications   Post-procedure details: wound care instructions given    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 Observe for recurrence. Call clinic for new or changing lesions.  Recommend regular skin exams, daily broad-spectrum spf 30+ sunscreen use, and photoprotection.     Inflamed seborrheic keratosis Left Chest  Destruction of lesion - Left Chest Complexity: simple   Destruction method: cryotherapy   Informed consent: discussed and consent obtained   Timeout:  patient name, date of birth, surgical site, and procedure verified Lesion destroyed using liquid nitrogen: Yes   Region frozen until ice ball extended beyond lesion: Yes   Outcome: patient tolerated procedure well with no complications   Post-procedure details: wound care instructions given    Tinea pedis of left foot toenails on bilateral feet  terbinafine (LAMISIL) 250 MG tablet - toenails on bilateral feet The patient has no history of liver problems.  He must stop any cholesterol /lipid medications while taking Lamisil.   Actinic Damage - diffuse scaly erythematous macules with underlying dyspigmentation - Recommend daily broad spectrum sunscreen SPF 30+ to sun-exposed areas, reapply every 2 hours as needed.  - Call for new or changing lesions.  Seborrheic Keratoses - Stuck-on, waxy, tan-brown papules and plaques  - Discussed benign etiology and prognosis. - Observe - Call for any changes  Melanocytic Nevi - Tan-brown and/or pink-flesh-colored symmetric macules and papules - Benign appearing on exam today - Observation - Call clinic for new or changing moles - Recommend  daily use of broad spectrum spf 30+ sunscreen to sun-exposed areas.   Hemangiomas - Red papules - Discussed benign nature - Observe - Call for any changes  Lentigines - Scattered tan macules - Discussed due to sun exposure - Benign, observe -  Call for any changes  Purpura - Violaceous macules and patches - Benign - Related to age, sun damage and/or use of blood thinners - Observe - Can use OTC arnica containing moisturizer such as Dermend Bruise Formula if desired - Call for worsening or other concerns   Return in about 1 month (around 10/22/2019) for toenail fungus.   Marene Lenz, CMA, am acting as scribe for Sarina Ser, MD   Documentation: I have reviewed the above documentation for accuracy and completeness, and I agree with the above.  Sarina Ser, MD

## 2019-09-22 ENCOUNTER — Encounter: Payer: Self-pay | Admitting: Dermatology

## 2019-10-06 ENCOUNTER — Encounter: Payer: Self-pay | Admitting: Dermatology

## 2019-10-19 DIAGNOSIS — Z125 Encounter for screening for malignant neoplasm of prostate: Secondary | ICD-10-CM | POA: Diagnosis not present

## 2019-10-19 DIAGNOSIS — R739 Hyperglycemia, unspecified: Secondary | ICD-10-CM | POA: Diagnosis not present

## 2019-10-19 DIAGNOSIS — E538 Deficiency of other specified B group vitamins: Secondary | ICD-10-CM | POA: Diagnosis not present

## 2019-10-19 DIAGNOSIS — E782 Mixed hyperlipidemia: Secondary | ICD-10-CM | POA: Diagnosis not present

## 2019-10-23 DIAGNOSIS — H353221 Exudative age-related macular degeneration, left eye, with active choroidal neovascularization: Secondary | ICD-10-CM | POA: Diagnosis not present

## 2019-10-26 ENCOUNTER — Other Ambulatory Visit: Payer: Self-pay

## 2019-10-26 ENCOUNTER — Ambulatory Visit (INDEPENDENT_AMBULATORY_CARE_PROVIDER_SITE_OTHER): Payer: PPO | Admitting: Dermatology

## 2019-10-26 DIAGNOSIS — B353 Tinea pedis: Secondary | ICD-10-CM | POA: Diagnosis not present

## 2019-10-26 DIAGNOSIS — E782 Mixed hyperlipidemia: Secondary | ICD-10-CM | POA: Diagnosis not present

## 2019-10-26 DIAGNOSIS — Z1212 Encounter for screening for malignant neoplasm of rectum: Secondary | ICD-10-CM | POA: Diagnosis not present

## 2019-10-26 DIAGNOSIS — Z125 Encounter for screening for malignant neoplasm of prostate: Secondary | ICD-10-CM | POA: Diagnosis not present

## 2019-10-26 DIAGNOSIS — B351 Tinea unguium: Secondary | ICD-10-CM | POA: Diagnosis not present

## 2019-10-26 DIAGNOSIS — Z0000 Encounter for general adult medical examination without abnormal findings: Secondary | ICD-10-CM | POA: Diagnosis not present

## 2019-10-26 DIAGNOSIS — E538 Deficiency of other specified B group vitamins: Secondary | ICD-10-CM | POA: Diagnosis not present

## 2019-10-26 DIAGNOSIS — H6123 Impacted cerumen, bilateral: Secondary | ICD-10-CM | POA: Diagnosis not present

## 2019-10-26 DIAGNOSIS — H353220 Exudative age-related macular degeneration, left eye, stage unspecified: Secondary | ICD-10-CM | POA: Diagnosis not present

## 2019-10-26 MED ORDER — TERBINAFINE HCL 250 MG PO TABS: 250.0000 mg | ORAL_TABLET | Freq: Every day | ORAL | 1 refills | Status: DC

## 2019-10-26 NOTE — Progress Notes (Signed)
   Follow-Up Visit   Subjective  Randall Cole is a 79 y.o. male who presents for the following: Nail Problem (1 month f/u toenail fungus, pt taking Terbinafine 250 mg daily with a good response).   The following portions of the chart were reviewed this encounter and updated as appropriate:  Allergies  Meds  Problems  Med Hx  Surg Hx  Fam Hx      Review of Systems:  No other skin or systemic complaints except as noted in HPI or Assessment and Plan.  Objective  Well appearing patient in no apparent distress; mood and affect are within normal limits.  A focused examination was performed including toenails . Relevant physical exam findings are noted in the Assessment and Plan.  Objective  toenails: Scaling and maceration web spaces and over distal and lateral soles.    Assessment & Plan    Tinea pedis and Unguium of both feet - severe - on systemic meds with potential side effects - pt doing well toenails  Reviewed labs dated 10-19-2019 WNL   Cont Terbinafine 250 mg take 1 tablet po qd #30 1 RF  Pt is not taking any cholesterol medications    Reordered Medications terbinafine (LAMISIL) 250 MG table  Reordered Medications terbinafine (LAMISIL) 250 MG tablet  Return in about 7 months (around 05/27/2020). IMarye Round, CMA, am acting as scribe for Sarina Ser, MD .  Documentation: I have reviewed the above documentation for accuracy and completeness, and I agree with the above.  Sarina Ser, MD

## 2019-10-27 DIAGNOSIS — H353221 Exudative age-related macular degeneration, left eye, with active choroidal neovascularization: Secondary | ICD-10-CM | POA: Diagnosis not present

## 2019-10-29 ENCOUNTER — Encounter: Payer: Self-pay | Admitting: Dermatology

## 2019-11-02 ENCOUNTER — Ambulatory Visit: Payer: PPO | Admitting: Dermatology

## 2019-12-01 DIAGNOSIS — H353221 Exudative age-related macular degeneration, left eye, with active choroidal neovascularization: Secondary | ICD-10-CM | POA: Diagnosis not present

## 2019-12-09 ENCOUNTER — Telehealth: Payer: Self-pay

## 2019-12-09 NOTE — Telephone Encounter (Signed)
Patient called to confirm that he did not need to have anymore RFs of Terbinafine after taking medication for 60 days?

## 2019-12-10 NOTE — Telephone Encounter (Signed)
Left message for patient to return my call.

## 2019-12-10 NOTE — Telephone Encounter (Signed)
Patient advised to continue for 90 supply.

## 2019-12-10 NOTE — Telephone Encounter (Signed)
He was to take total of 90 days.  He took 30 days, then saw [place] again and we prescribed another 30 days with 1 RF for total 90 days. If he did not get his additional refill, he needs to do so and take it for total 90 days.

## 2020-01-12 DIAGNOSIS — H353221 Exudative age-related macular degeneration, left eye, with active choroidal neovascularization: Secondary | ICD-10-CM | POA: Diagnosis not present

## 2020-01-28 DIAGNOSIS — Z23 Encounter for immunization: Secondary | ICD-10-CM | POA: Diagnosis not present

## 2020-01-28 DIAGNOSIS — B349 Viral infection, unspecified: Secondary | ICD-10-CM | POA: Diagnosis not present

## 2020-02-16 DIAGNOSIS — H353221 Exudative age-related macular degeneration, left eye, with active choroidal neovascularization: Secondary | ICD-10-CM | POA: Diagnosis not present

## 2020-03-09 ENCOUNTER — Other Ambulatory Visit (HOSPITAL_COMMUNITY): Payer: Self-pay | Admitting: Internal Medicine

## 2020-03-09 ENCOUNTER — Other Ambulatory Visit: Payer: Self-pay | Admitting: Internal Medicine

## 2020-03-09 DIAGNOSIS — R1084 Generalized abdominal pain: Secondary | ICD-10-CM

## 2020-03-09 DIAGNOSIS — K859 Acute pancreatitis without necrosis or infection, unspecified: Secondary | ICD-10-CM | POA: Diagnosis not present

## 2020-03-22 DIAGNOSIS — H353221 Exudative age-related macular degeneration, left eye, with active choroidal neovascularization: Secondary | ICD-10-CM | POA: Diagnosis not present

## 2020-03-25 ENCOUNTER — Ambulatory Visit
Admission: RE | Admit: 2020-03-25 | Discharge: 2020-03-25 | Disposition: A | Discharge status: Home / Self Care | Payer: PPO | Source: Ambulatory Visit | Attending: Internal Medicine | Admitting: Internal Medicine

## 2020-03-25 ENCOUNTER — Other Ambulatory Visit: Payer: Self-pay

## 2020-03-25 DIAGNOSIS — R1084 Generalized abdominal pain: Secondary | ICD-10-CM

## 2020-03-25 DIAGNOSIS — K859 Acute pancreatitis without necrosis or infection, unspecified: Secondary | ICD-10-CM | POA: Diagnosis not present

## 2020-03-25 DIAGNOSIS — R1110 Vomiting, unspecified: Secondary | ICD-10-CM | POA: Diagnosis not present

## 2020-03-25 MED ORDER — IOHEXOL 300 MG/ML  SOLN
100.0000 mL | Freq: Once | INTRAMUSCULAR | Status: AC | PRN
  Administered 2020-03-25: 100 mL via INTRAVENOUS

## 2020-03-30 DIAGNOSIS — I700 Atherosclerosis of aorta: Secondary | ICD-10-CM | POA: Insufficient documentation

## 2020-04-21 DIAGNOSIS — G5603 Carpal tunnel syndrome, bilateral upper limbs: Secondary | ICD-10-CM | POA: Diagnosis not present

## 2020-04-25 DIAGNOSIS — G5603 Carpal tunnel syndrome, bilateral upper limbs: Secondary | ICD-10-CM | POA: Diagnosis not present

## 2020-04-26 DIAGNOSIS — H353221 Exudative age-related macular degeneration, left eye, with active choroidal neovascularization: Secondary | ICD-10-CM | POA: Diagnosis not present

## 2020-05-25 ENCOUNTER — Ambulatory Visit: Payer: PPO | Admitting: Dermatology

## 2020-06-01 ENCOUNTER — Ambulatory Visit: Payer: PPO | Admitting: Dermatology

## 2020-06-01 ENCOUNTER — Other Ambulatory Visit: Payer: Self-pay

## 2020-06-01 DIAGNOSIS — B351 Tinea unguium: Secondary | ICD-10-CM

## 2020-06-01 DIAGNOSIS — L820 Inflamed seborrheic keratosis: Secondary | ICD-10-CM

## 2020-06-01 DIAGNOSIS — D1800 Hemangioma unspecified site: Secondary | ICD-10-CM | POA: Diagnosis not present

## 2020-06-01 DIAGNOSIS — D229 Melanocytic nevi, unspecified: Secondary | ICD-10-CM | POA: Diagnosis not present

## 2020-06-01 DIAGNOSIS — Z85828 Personal history of other malignant neoplasm of skin: Secondary | ICD-10-CM

## 2020-06-01 DIAGNOSIS — L578 Other skin changes due to chronic exposure to nonionizing radiation: Secondary | ICD-10-CM

## 2020-06-01 DIAGNOSIS — L814 Other melanin hyperpigmentation: Secondary | ICD-10-CM

## 2020-06-01 DIAGNOSIS — Z1283 Encounter for screening for malignant neoplasm of skin: Secondary | ICD-10-CM

## 2020-06-01 DIAGNOSIS — L570 Actinic keratosis: Secondary | ICD-10-CM

## 2020-06-01 DIAGNOSIS — L821 Other seborrheic keratosis: Secondary | ICD-10-CM

## 2020-06-01 NOTE — Progress Notes (Unsigned)
Follow-Up Visit   Subjective  Randall Cole is a 80 y.o. male who presents for the following: Upper body skin exam (Hx of SCC mid vertex scalp, hx of Aks) and tinea pedis/unguium (Bil feet/toenails, 54m txt of Lamisil 71m f/u). The patient presents for Upper Body Skin Exam (UBSE) for skin cancer screening and mole check.  The following portions of the chart were reviewed this encounter and updated as appropriate:   Allergies  Meds  Problems  Med Hx  Surg Hx  Fam Hx     Review of Systems:  No other skin or systemic complaints except as noted in HPI or Assessment and Plan.  Objective  Well appearing patient in no apparent distress; mood and affect are within normal limits.  A focused examination was performed including All skin from the waist up and feet/toenails. Relevant physical exam findings are noted in the Assessment and Plan.  Objective  Mid vertex scalp: Well healed scar with no evidence of recurrence, no lymphadenopathy.   Objective  L ant shoulder x 1, forehead/scalp x 19 (20): Pink scaly macules   Objective  L cheek x 1: Erythematous keratotic or waxy stuck-on papule or plaque.   Objective  bil feet/toenails: Toenail dystrophy   Assessment & Plan    Lentigines - Scattered tan macules - Discussed due to sun exposure - Benign, observe - Call for any changes  Seborrheic Keratoses - Stuck-on, waxy, tan-brown papules and plaques  - Discussed benign etiology and prognosis. - Observe - Call for any changes  Melanocytic Nevi - Tan-brown and/or pink-flesh-colored symmetric macules and papules - Benign appearing on exam today - Observation - Call clinic for new or changing moles - Recommend daily use of broad spectrum spf 30+ sunscreen to sun-exposed areas.   Hemangiomas - Red papules - Discussed benign nature - Observe - Call for any changes  Skin cancer screening performed today.  Actinic Damage - Severe, chronic, secondary to cumulative UV  radiation exposure over time - diffuse scaly erythematous macules and papules with underlying dyspigmentation - Discussed Prescription "Field Treatment" for Severe, Chronic Confluent Actinic Changes with Pre-Cancerous Actinic Keratoses Field treatment involves treatment of an entire area of skin that has confluent Actinic Changes (Sun/ Ultraviolet light damage) and PreCancerous Actinic Keratoses by method of PhotoDynamic Therapy (PDT) and/or prescription Topical Chemotherapy agents such as 5-fluorouracil, 5-fluorouracil/calcipotriene, and/or imiquimod.  The purpose is to decrease the number of clinically evident and subclinical PreCancerous lesions to prevent progression to development of skin cancer by chemically destroying early precancer changes that may or may not be visible.  It has been shown to reduce the risk of developing skin cancer in the treated area. As a result of treatment, redness, scaling, crusting, and open sores may occur during treatment course. One or more than one of these methods may be used and may have to be used several times to control, suppress and eliminate the PreCancerous changes. Discussed treatment course, expected reaction, and possible side effects. - Recommend daily broad spectrum sunscreen SPF 30+ to sun-exposed areas, reapply every 2 hours as needed.  - Call for new or changing lesions.  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 Observe for recurrence. Call clinic for new or changing lesions.  Recommend regular skin exams, daily broad-spectrum spf 30+ sunscreen use, and photoprotection.     AK (actinic keratosis) (20) L ant shoulder x 1, forehead/scalp x 19  Recommend PDT with ALA to forehead/scalp in 1 month  Destruction of lesion - L ant shoulder x 1, forehead/scalp x 19 Complexity: simple   Destruction method: cryotherapy   Informed consent: discussed and consent obtained   Timeout:  patient name, date of birth, surgical  site, and procedure verified Lesion destroyed using liquid nitrogen: Yes   Region frozen until ice ball extended beyond lesion: Yes   Outcome: patient tolerated procedure well with no complications   Post-procedure details: wound care instructions given    Inflamed seborrheic keratosis L cheek x 1  Destruction of lesion - L cheek x 1 Complexity: simple   Destruction method: cryotherapy   Informed consent: discussed and consent obtained   Timeout:  patient name, date of birth, surgical site, and procedure verified Lesion destroyed using liquid nitrogen: Yes   Region frozen until ice ball extended beyond lesion: Yes   Outcome: patient tolerated procedure well with no complications   Post-procedure details: wound care instructions given    Tinea unguium bil feet/toenails Chronic; persistent.  Not to goal Improved from 3 months of Lamisil Discussed another month of Lamisil, pt declines  Skin cancer screening  Return in about 1 month (around 07/02/2020) for PDT scalp and forehead, then 47m f/u Randall Cole for [place] f/u.   I, Randall Cole, RMA, am acting as scribe for Randall Ser, MD .  Documentation: I have reviewed the above documentation for accuracy and completeness, and I agree with the above.  Randall Ser, MD

## 2020-06-05 ENCOUNTER — Encounter: Payer: Self-pay | Admitting: Dermatology

## 2020-06-07 DIAGNOSIS — H353221 Exudative age-related macular degeneration, left eye, with active choroidal neovascularization: Secondary | ICD-10-CM | POA: Diagnosis not present

## 2020-06-08 DIAGNOSIS — Z23 Encounter for immunization: Secondary | ICD-10-CM | POA: Diagnosis not present

## 2020-06-17 ENCOUNTER — Other Ambulatory Visit: Payer: Self-pay | Admitting: Dermatology

## 2020-06-17 DIAGNOSIS — B353 Tinea pedis: Secondary | ICD-10-CM

## 2020-07-06 ENCOUNTER — Other Ambulatory Visit: Payer: Self-pay

## 2020-07-06 ENCOUNTER — Ambulatory Visit: Payer: PPO

## 2020-07-06 DIAGNOSIS — L570 Actinic keratosis: Secondary | ICD-10-CM

## 2020-07-06 MED ORDER — AMINOLEVULINIC ACID HCL 20 % EX SOLR
1.0000 "application " | Freq: Once | CUTANEOUS | Status: AC
  Administered 2020-07-06: 354 mg via TOPICAL

## 2020-07-06 NOTE — Patient Instructions (Signed)

## 2020-07-06 NOTE — Progress Notes (Signed)
Patient completed PDT therapy today.  1. AK (actinic keratosis) (2) Head - Anterior (Face); Scalp  Photodynamic therapy - Scalp Procedure discussed: discussed risks, benefits, side effects. and alternatives   Prep: site scrubbed/prepped with acetone   Location:  Scalp and forehead Number of lesions:  Multiple Type of treatment:  Blue light Aminolevulinic Acid (see MAR for details): Levulan Number of Levulan sticks used:  1 Incubation time (minutes):  120 Number of minutes under lamp:  16 Number of seconds under lamp:  40 Cooling:  Floor fan Outcome: patient tolerated procedure well with no complications   Post-procedure details: sunscreen applied    Aminolevulinic Acid HCl 20 % SOLR 354 mg - Scalp

## 2020-07-19 DIAGNOSIS — H353221 Exudative age-related macular degeneration, left eye, with active choroidal neovascularization: Secondary | ICD-10-CM | POA: Diagnosis not present

## 2020-08-01 ENCOUNTER — Ambulatory Visit (INDEPENDENT_AMBULATORY_CARE_PROVIDER_SITE_OTHER): Payer: PPO | Admitting: Gastroenterology

## 2020-08-01 ENCOUNTER — Other Ambulatory Visit: Payer: Self-pay

## 2020-08-01 ENCOUNTER — Encounter: Payer: Self-pay | Admitting: Gastroenterology

## 2020-08-01 VITALS — BP 167/71 | HR 79 | Temp 98.4°F | Ht 69.5 in | Wt 158.4 lb

## 2020-08-01 DIAGNOSIS — R1012 Left upper quadrant pain: Secondary | ICD-10-CM

## 2020-08-01 DIAGNOSIS — R194 Change in bowel habit: Secondary | ICD-10-CM

## 2020-08-01 DIAGNOSIS — R634 Abnormal weight loss: Secondary | ICD-10-CM | POA: Diagnosis not present

## 2020-08-01 MED ORDER — CLENPIQ 10-3.5-12 MG-GM -GM/160ML PO SOLN: 320.0000 mL | ORAL | 0 refills | Status: DC

## 2020-08-01 NOTE — Progress Notes (Signed)
Gastroenterology Consultation  Referring Provider:     Rusty Aus, MD Primary Care Physician:  Rusty Aus, MD Primary Gastroenterologist:  Dr. Allen Norris     Reason for Consultation:    Abdominal pain, bloating, gas and discomfort in the stomach area, loss of weight and fatigue        HPI:   Randall Cole is a 80 y.o. y/o male referred for consultation & management of abdominal pain bloating fatigue and weight loss by Dr. Sabra Heck, Christean Grief, MD.  This patient comes to me after he had requested a referral from his primary care doctor for abdominal pain with bloating and fatigue.  The patient had also reported that he had a drop in his weight.  On October 27 of 2021 the patient had a normal hemoglobin and hematocrit.  At that time the patient was also noted to have normal liver enzymes. The patient reports that he has lost approximately 20 pounds over the last year.  He cannot remember when he had his last colonoscopy but thinks it was at [facility].  On checking the chart his last colonoscopy on record was in 2008. The patient reports that he has some episodes of intermittent vomiting but reports that he has such abdominal pain on the left side that he really feels like he has to force himself to vomit to feel better.  He also reports that he has had a change in bowel habits notably of less stool on the toilet paper when he wipes that he has had in the past. He denies the abdominal pain to be with any particular food. 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  Past Medical History:  Diagnosis Date  . Hypertension   . Squamous cell carcinoma of skin 12/25/2018   mid vertex scalp/EDC    Past Surgical History:  Procedure Laterality Date  . HERNIA REPAIR      Prior to Admission medications    Medication Sig Start Date End Date Taking? Authorizing Provider  amLODipine (NORVASC) 10 MG tablet Take 10 mg by mouth daily. 06/27/19   [provider]  ascorbic acid (VITAMIN C) 500 MG tablet Take by mouth.    [provider]  aspirin 81 MG EC tablet Take by mouth. 03/03/12   [provider]  cyanocobalamin 1000 MCG tablet Take by mouth.    [provider]  doxazosin (CARDURA) 2 MG tablet SMARTSIG:1 Tablet(s) By Mouth Every Evening 06/27/19   [provider]  finasteride (PROSCAR) 5 MG tablet Take by mouth. 10/22/18   [provider]  hyoscyamine (LEVSIN SL) 0.125 MG SL tablet Place under the tongue every 4 (four) hours as needed. 08/18/19   [provider]  meclizine (ANTIVERT) 25 MG tablet Take 1 tablet (25 mg total) by mouth 3 (three) times daily as needed for dizziness. 09/24/15   Orbie Pyo, MD  metoprolol tartrate (LOPRESSOR) 25 MG tablet  09/03/19   [provider]  pantoprazole (PROTONIX) 40 MG tablet  09/02/19   [provider]  pyridOXINE (VITAMIN B-6) 25 MG tablet Take by mouth.    [provider]  Saw Palmetto 500 MG CAPS Take by mouth.    [provider]  terbinafine (LAMISIL) 250 MG tablet TAKE 1 TABLET BY MOUTH  EVERY DAY 06/20/20   Ralene Bathe, MD    No family history on file.   Social History   Tobacco Use  . Smoking status: Never Smoker  Substance Use Topics  . Alcohol use: Yes    Allergies as of 08/01/2020 - Review Complete 06/05/2020  Allergen Reaction Noted  . Statins Nausea And Vomiting and Other (See Comments) 09/24/2015    Review of Systems:    All systems reviewed and negative except where noted in HPI.   Physical Exam:  There were no vitals taken for this visit. No LMP for male patient. General:   Alert,  Well-developed, well-nourished, pleasant and cooperative in NAD Head:  Normocephalic and atraumatic. Eyes:  Sclera clear, no icterus.    Conjunctiva pink. Ears:  Normal auditory acuity. Neck:  Supple; no masses or thyromegaly. Lungs:  Respirations even and unlabored.  Clear throughout to auscultation.   No wheezes, crackles, or rhonchi. No acute distress. Heart:  Regular rate and rhythm; no murmurs, clicks, rubs, or gallops. Abdomen:  Normal bowel sounds.  No bruits.  Soft, non-tender and non-distended without masses, hepatosplenomegaly or hernias noted.  No guarding or rebound tenderness.  Negative Carnett sign.   Rectal:  Deferred.  Pulses:  Normal pulses noted. Extremities:  No clubbing or edema.  No cyanosis. Neurologic:  Alert and oriented x3;  grossly normal neurologically. Skin:  Intact without significant lesions or rashes.  No jaundice. Lymph Nodes:  No significant cervical adenopathy. Psych:  Alert and cooperative. Normal mood and affect.  Imaging Studies: No results found.  Assessment and Plan:   Randall Cole is a 80 y.o. y/o male who comes in today with a history of weight loss with a subjective change in bowel habits.  The patient also states that he has left sided abdominal pain that is relieved when he vomits.  The patient has also had an unexplained weight loss.  He states that he cut down on his drinking because he thought that would help but it has not. The patient will be set up for an EGD and colonoscopy to look for a source of his symptoms. The patient has been explained the plan and agrees with it.    Lucilla Lame, MD. Marval Regal    Note: This dictation was prepared with Dragon dictation along with smaller phrase technology. Any transcriptional errors that result from this process are unintentional.

## 2020-08-02 ENCOUNTER — Other Ambulatory Visit: Payer: Self-pay

## 2020-08-02 DIAGNOSIS — R634 Abnormal weight loss: Secondary | ICD-10-CM

## 2020-08-02 DIAGNOSIS — R194 Change in bowel habit: Secondary | ICD-10-CM

## 2020-08-02 DIAGNOSIS — R1012 Left upper quadrant pain: Secondary | ICD-10-CM

## 2020-08-04 ENCOUNTER — Other Ambulatory Visit: Payer: Self-pay

## 2020-08-04 ENCOUNTER — Encounter: Payer: Self-pay | Admitting: Gastroenterology

## 2020-08-04 DIAGNOSIS — H900 Conductive hearing loss, bilateral: Secondary | ICD-10-CM | POA: Diagnosis not present

## 2020-08-04 DIAGNOSIS — H6123 Impacted cerumen, bilateral: Secondary | ICD-10-CM | POA: Diagnosis not present

## 2020-08-10 ENCOUNTER — Other Ambulatory Visit
Admission: RE | Admit: 2020-08-10 | Discharge: 2020-08-10 | Disposition: A | Discharge status: Home / Self Care | Payer: PPO | Source: Ambulatory Visit | Attending: Gastroenterology | Admitting: Gastroenterology

## 2020-08-10 ENCOUNTER — Other Ambulatory Visit: Payer: Self-pay

## 2020-08-10 DIAGNOSIS — Z01812 Encounter for preprocedural laboratory examination: Secondary | ICD-10-CM | POA: Diagnosis not present

## 2020-08-10 DIAGNOSIS — Z20822 Contact with and (suspected) exposure to covid-19: Secondary | ICD-10-CM | POA: Diagnosis not present

## 2020-08-10 LAB — SARS CORONAVIRUS 2 (TAT 6-24 HRS): SARS Coronavirus 2: NEGATIVE

## 2020-08-11 NOTE — Discharge Instructions (Signed)

## 2020-08-12 ENCOUNTER — Other Ambulatory Visit: Payer: Self-pay

## 2020-08-12 ENCOUNTER — Ambulatory Visit: Payer: PPO | Admitting: Anesthesiology

## 2020-08-12 ENCOUNTER — Ambulatory Visit
Admission: RE | Admit: 2020-08-12 | Discharge: 2020-08-12 | Disposition: A | Discharge status: Home / Self Care | Payer: PPO | Attending: Gastroenterology | Admitting: Gastroenterology

## 2020-08-12 ENCOUNTER — Encounter: Payer: Self-pay | Admitting: Gastroenterology

## 2020-08-12 ENCOUNTER — Encounter
Admission: RE | Disposition: A | Discharge status: Home / Self Care | Payer: Self-pay | Source: Home / Self Care | Attending: Gastroenterology

## 2020-08-12 DIAGNOSIS — K641 Second degree hemorrhoids: Secondary | ICD-10-CM | POA: Insufficient documentation

## 2020-08-12 DIAGNOSIS — R1012 Left upper quadrant pain: Secondary | ICD-10-CM | POA: Diagnosis not present

## 2020-08-12 DIAGNOSIS — K2970 Gastritis, unspecified, without bleeding: Secondary | ICD-10-CM | POA: Insufficient documentation

## 2020-08-12 DIAGNOSIS — R634 Abnormal weight loss: Secondary | ICD-10-CM | POA: Diagnosis not present

## 2020-08-12 DIAGNOSIS — Z7982 Long term (current) use of aspirin: Secondary | ICD-10-CM | POA: Diagnosis not present

## 2020-08-12 DIAGNOSIS — R194 Change in bowel habit: Secondary | ICD-10-CM | POA: Diagnosis not present

## 2020-08-12 DIAGNOSIS — I10 Essential (primary) hypertension: Secondary | ICD-10-CM | POA: Insufficient documentation

## 2020-08-12 DIAGNOSIS — R1013 Epigastric pain: Secondary | ICD-10-CM | POA: Diagnosis not present

## 2020-08-12 DIAGNOSIS — Z6822 Body mass index (BMI) 22.0-22.9, adult: Secondary | ICD-10-CM | POA: Diagnosis not present

## 2020-08-12 DIAGNOSIS — Z888 Allergy status to other drugs, medicaments and biological substances status: Secondary | ICD-10-CM | POA: Insufficient documentation

## 2020-08-12 DIAGNOSIS — Z79899 Other long term (current) drug therapy: Secondary | ICD-10-CM | POA: Diagnosis not present

## 2020-08-12 DIAGNOSIS — K2950 Unspecified chronic gastritis without bleeding: Secondary | ICD-10-CM | POA: Diagnosis not present

## 2020-08-12 HISTORY — PX: COLONOSCOPY WITH PROPOFOL: SHX5780

## 2020-08-12 HISTORY — PX: ESOPHAGOGASTRODUODENOSCOPY (EGD) WITH PROPOFOL: SHX5813

## 2020-08-12 HISTORY — DX: Carpal tunnel syndrome, unspecified upper limb: G56.00

## 2020-08-12 SURGERY — COLONOSCOPY WITH PROPOFOL
Anesthesia: General

## 2020-08-12 MED ORDER — PROPOFOL 10 MG/ML IV BOLUS
INTRAVENOUS | Status: DC | PRN
  Administered 2020-08-12 (×2): 20 mg via INTRAVENOUS
  Administered 2020-08-12: 80 mg via INTRAVENOUS
  Administered 2020-08-12: 40 mg via INTRAVENOUS
  Administered 2020-08-12 (×2): 20 mg via INTRAVENOUS
  Administered 2020-08-12: 40 mg via INTRAVENOUS
  Administered 2020-08-12 (×4): 20 mg via INTRAVENOUS

## 2020-08-12 MED ORDER — LACTATED RINGERS IV SOLN: INTRAVENOUS | Status: DC

## 2020-08-12 MED ORDER — FENTANYL CITRATE (PF) 100 MCG/2ML IJ SOLN: 25.0000 ug | INTRAMUSCULAR | Status: DC | PRN

## 2020-08-12 MED ORDER — OXYCODONE HCL 5 MG/5ML PO SOLN: 5.0000 mg | Freq: Once | ORAL | Status: DC | PRN

## 2020-08-12 MED ORDER — MEPERIDINE HCL 25 MG/ML IJ SOLN: 6.2500 mg | INTRAMUSCULAR | Status: DC | PRN

## 2020-08-12 MED ORDER — OXYCODONE HCL 5 MG PO TABS: 5.0000 mg | ORAL_TABLET | Freq: Once | ORAL | Status: DC | PRN

## 2020-08-12 MED ORDER — SODIUM CHLORIDE 0.9 % IV SOLN: INTRAVENOUS | Status: DC

## 2020-08-12 MED ORDER — LIDOCAINE HCL (CARDIAC) PF 100 MG/5ML IV SOSY
PREFILLED_SYRINGE | INTRAVENOUS | Status: DC | PRN
  Administered 2020-08-12: 40 mg via INTRAVENOUS

## 2020-08-12 MED ORDER — PROMETHAZINE HCL 25 MG/ML IJ SOLN: 6.2500 mg | INTRAMUSCULAR | Status: DC | PRN

## 2020-08-12 SURGICAL SUPPLY — 38 items
BALLN DILATOR 10-12 8 (BALLOONS)
BALLN DILATOR 12-15 8 (BALLOONS)
BALLN DILATOR 15-18 8 (BALLOONS)
BALLN DILATOR CRE 0-12 8 (BALLOONS)
BALLN DILATOR ESOPH 8 10 CRE (MISCELLANEOUS) IMPLANT
BALLOON DILATOR 12-15 8 (BALLOONS) IMPLANT
BALLOON DILATOR 15-18 8 (BALLOONS) IMPLANT
BALLOON DILATOR CRE 0-12 8 (BALLOONS) IMPLANT
BLOCK BITE 60FR ADLT L/F GRN (MISCELLANEOUS) ×2 IMPLANT
CLIP HMST 235XBRD CATH ROT (MISCELLANEOUS) IMPLANT
CLIP RESOLUTION 360 11X235 (MISCELLANEOUS)
ELECT REM PT RETURN 9FT ADLT (ELECTROSURGICAL)
ELECTRODE REM PT RTRN 9FT ADLT (ELECTROSURGICAL) IMPLANT
FCP ESCP3.2XJMB 240X2.8X (MISCELLANEOUS)
FORCEPS BIOP RAD 4 LRG CAP 4 (CUTTING FORCEPS) ×2 IMPLANT
FORCEPS BIOP RJ4 240 W/NDL (MISCELLANEOUS)
FORCEPS ESCP3.2XJMB 240X2.8X (MISCELLANEOUS) IMPLANT
GOWN CVR UNV OPN BCK APRN NK (MISCELLANEOUS) ×2 IMPLANT
GOWN ISOL THUMB LOOP REG UNIV (MISCELLANEOUS) ×4
INJECTOR VARIJECT VIN23 (MISCELLANEOUS) IMPLANT
KIT DEFENDO VALVE AND CONN (KITS) IMPLANT
KIT PRC NS LF DISP ENDO (KITS) ×1 IMPLANT
KIT PROCEDURE OLYMPUS (KITS) ×2
MANIFOLD NEPTUNE II (INSTRUMENTS) ×2 IMPLANT
MARKER SPOT ENDO TATTOO 5ML (MISCELLANEOUS) IMPLANT
PROBE APC STR FIRE (PROBE) IMPLANT
RETRIEVER NET PLAT FOOD (MISCELLANEOUS) IMPLANT
RETRIEVER NET ROTH 2.5X230 LF (MISCELLANEOUS) IMPLANT
SNARE COLD EXACTO (MISCELLANEOUS) IMPLANT
SNARE SHORT THROW 13M SML OVAL (MISCELLANEOUS) IMPLANT
SNARE SHORT THROW 30M LRG OVAL (MISCELLANEOUS) IMPLANT
SNARE SNG USE RND 15MM (INSTRUMENTS) IMPLANT
SPOT EX ENDOSCOPIC TATTOO (MISCELLANEOUS)
SYR INFLATION 60ML (SYRINGE) IMPLANT
TRAP ETRAP POLY (MISCELLANEOUS) IMPLANT
VARIJECT INJECTOR VIN23 (MISCELLANEOUS)
WATER STERILE IRR 250ML POUR (IV SOLUTION) ×2 IMPLANT
WIRE CRE 18-20MM 8CM F G (MISCELLANEOUS) IMPLANT

## 2020-08-12 NOTE — Anesthesia Postprocedure Evaluation (Signed)
Anesthesia Post Note  Patient: Randall Cole  Procedure(s) Performed: COLONOSCOPY WITH PROPOFOL (N/A ) ESOPHAGOGASTRODUODENOSCOPY (EGD) WITH PROPOFOL (N/A )     Patient location during evaluation: PACU Anesthesia Type: General Level of consciousness: awake and alert Pain management: pain level controlled Vital Signs Assessment: post-procedure vital signs reviewed and stable Respiratory status: spontaneous breathing, nonlabored ventilation, respiratory function stable and patient connected to nasal cannula oxygen Cardiovascular status: blood pressure returned to baseline and stable Postop Assessment: no apparent nausea or vomiting Anesthetic complications: no   No complications documented.  Doral Digangi, Glade Stanford

## 2020-08-12 NOTE — H&P (Signed)
Lucilla Lame, MD [address] [place], [place] [address] Phone:[phone] Fax : [phone]  Primary Care Physician:  Rusty Aus, MD Primary Gastroenterologist:  Dr. Allen Norris  Pre-Procedure History & Physical: HPI:  Randall Cole is a 80 y.o. male is here for an endoscopy and colonoscopy.   Past Medical History:  Diagnosis Date  . Carpal tunnel syndrome    bilateral  . Hypertension   . Squamous cell carcinoma of skin 12/25/2018   mid vertex scalp/EDC    Past Surgical History:  Procedure Laterality Date  . COLONOSCOPY    . HERNIA REPAIR      Prior to Admission medications   Medication Sig Start Date End Date Taking? Authorizing Provider  amLODipine (NORVASC) 10 MG tablet Take 10 mg by mouth daily. 06/27/19  Yes [provider]  ascorbic acid (VITAMIN C) 500 MG tablet Take by mouth.   Yes [provider]  aspirin 81 MG EC tablet Take by mouth. 03/03/12  Yes [provider]  doxazosin (CARDURA) 2 MG tablet SMARTSIG:1 Tablet(s) By Mouth Every Evening 06/27/19  Yes [provider]  finasteride (PROSCAR) 5 MG tablet Take by mouth. 10/22/18  Yes [provider]  metoprolol tartrate (LOPRESSOR) 25 MG tablet  09/03/19  Yes [provider]  Multiple Vitamins-Minerals (VISION-VITE PRESERVE PO) Take by mouth.   Yes [provider]  pyridOXINE (VITAMIN B-6) 25 MG tablet Take by mouth.   Yes [provider]  Saw Palmetto 500 MG CAPS Take by mouth.   Yes [provider]  Sod Picosulfate-Mag Ox-Cit Acd (CLENPIQ) 10-3.5-12 MG-GM -GM/160ML SOLN Take 320 mLs by mouth as directed. 08/01/20  Yes Lucilla Lame, MD  Turmeric (QC TUMERIC COMPLEX PO) Take by mouth.   Yes [provider]  vitamin E 1000 UNIT capsule Take 1,000 Units by mouth daily.   Yes [provider]  Zinc 100 MG TABS Take by mouth.   Yes [provider]  cyanocobalamin 1000 MCG tablet Take by mouth.     [provider]  hyoscyamine (LEVSIN SL) 0.125 MG SL tablet Place under the tongue every 4 (four) hours as needed. Patient not taking: Reported on 08/01/2020 08/18/19   [provider]  meclizine (ANTIVERT) 25 MG tablet Take 1 tablet (25 mg total) by mouth 3 (three) times daily as needed for dizziness. Patient not taking: Reported on 08/01/2020 09/24/15   Schaevitz, Randall An, MD  meloxicam (MOBIC) 15 MG tablet Take 15 mg by mouth daily. Patient not taking: Reported on 08/04/2020 07/19/20   [provider]  pantoprazole (PROTONIX) 40 MG tablet  09/02/19   [provider]  terbinafine (LAMISIL) 250 MG tablet TAKE 1 TABLET BY MOUTH EVERY DAY Patient not taking: Reported on 08/01/2020 06/20/20   Ralene Bathe, MD    Allergies as of 08/02/2020 - Review Complete 08/01/2020  Allergen Reaction Noted  . Neutral red  10/29/2012  . Statins Nausea And Vomiting and Other (See Comments) 09/24/2015  . 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 No pertinent family history.  Social History   Socioeconomic History  . Marital status: Married    Spouse name: Not on file  . Number of children: Not on file  . Years of education: Not on file  . Highest education level: Not on file  Occupational History  . Not on file  Tobacco Use  . Smoking status: Never Smoker  . Smokeless tobacco: Never Used  Substance and Sexual Activity  . Alcohol use: Yes    Alcohol/week: 2.0 standard drinks    Types: 2 Cans of beer per week  . Drug use: Never  . Sexual activity: Not on file  Other Topics Concern  . Not on file  Social History Narrative  . Not on file   Social Determinants of Health   Financial Resource Strain: Not on file  Food Insecurity: Not on file  Transportation Needs: Not on file  Physical Activity: Not on file  Stress: Not on file  Social Connections: Not on file  Intimate Partner Violence: Not on file    Review of  Systems: See HPI, otherwise negative ROS  Physical Exam: BP 140/73   Pulse 62   Temp (!) 97.3 F (36.3 C) (Temporal)   Resp 20   Ht 5\' 9"  (1.753 m)   Wt 69.4 kg   SpO2 98%   BMI 22.59 kg/m  General:   Alert,  pleasant and cooperative in NAD Head:  Normocephalic and atraumatic. Neck:  Supple; no masses or thyromegaly. Lungs:  Clear throughout to auscultation.    Heart:  Regular rate and rhythm. Abdomen:  Soft, nontender and nondistended. Normal bowel sounds, without guarding, and without rebound.   Neurologic:  Alert and  oriented x4;  grossly normal neurologically.  Impression/Plan: Randall Cole is here for an endoscopy and colonoscopy to be performed for epigastric pain and weight loss  Risks, benefits, limitations, and alternatives regarding  endoscopy and colonoscopy have been reviewed with the patient.  Questions have been answered.  All parties agreeable.   Lucilla Lame, MD  08/12/2020, 10:06 AM

## 2020-08-12 NOTE — Transfer of Care (Signed)
Immediate Anesthesia Transfer of Care Note  Patient: Randall Cole  Procedure(s) Performed: COLONOSCOPY WITH PROPOFOL (N/A ) ESOPHAGOGASTRODUODENOSCOPY (EGD) WITH PROPOFOL (N/A )  Patient Location: PACU  Anesthesia Type: General  Level of Consciousness: awake, alert  and patient cooperative  Airway and Oxygen Therapy: Patient Spontanous Breathing   Post-op Assessment: Post-op Vital signs reviewed, Patient's Cardiovascular Status Stable, Respiratory Function Stable, Patent Airway and No signs of Nausea or vomiting  Post-op Vital Signs: Reviewed and stable  Complications: No complications documented.

## 2020-08-12 NOTE — Anesthesia Preprocedure Evaluation (Signed)
Anesthesia Evaluation  Patient identified by MRN, date of birth, ID band Patient awake    Reviewed: Allergy & Precautions, H&P , NPO status , Patient's Chart, lab work & pertinent test results, reviewed documented beta blocker date and time   Airway Mallampati: II  TM Distance: >3 FB Neck ROM: full    Dental no notable dental hx.    Pulmonary neg pulmonary ROS,    Pulmonary exam normal breath sounds clear to auscultation       Cardiovascular Exercise Tolerance: Good hypertension,  Rhythm:regular Rate:Normal     Neuro/Psych negative neurological ROS  negative psych ROS   GI/Hepatic negative GI ROS, Neg liver ROS,   Endo/Other  negative endocrine ROS  Renal/GU negative Renal ROS  negative genitourinary   Musculoskeletal   Abdominal   Peds  Hematology negative hematology ROS (+)   Anesthesia Other Findings   Reproductive/Obstetrics negative OB ROS                             Anesthesia Physical Anesthesia Plan  ASA: II  Anesthesia Plan: General   Post-op Pain Management:    Induction:   PONV Risk Score and Plan:   Airway Management Planned:   Additional Equipment:   Intra-op Plan:   Post-operative Plan:   Informed Consent: I have reviewed the patients History and Physical, chart, labs and discussed the procedure including the risks, benefits and alternatives for the proposed anesthesia with the patient or authorized representative who has indicated his/her understanding and acceptance.     Dental Advisory Given  Plan Discussed with: CRNA  Anesthesia Plan Comments:         Anesthesia Quick Evaluation

## 2020-08-12 NOTE — Op Note (Signed)
[facility] Gastroenterology Patient Name: Randall Cole Procedure Date: 08/12/2020 10:12 AM MRN: [id number] Account #: [id number] Date of Birth: [birth year] Admit Type: Outpatient Age: 80 Room: [facility] OR ROOM 01 Gender: Male Note Status: Finalized Procedure:             Upper GI endoscopy Indications:           Epigastric abdominal pain Providers:             Lucilla Lame MD, MD Referring MD:          Rusty Aus, MD (Referring MD) Medicines:             Propofol per Anesthesia Complications:         No immediate complications. Procedure:             Pre-Anesthesia Assessment:                        - Prior to the procedure, a History and Physical was                         performed, and patient medications and allergies were                         reviewed. The patient's tolerance of previous                         anesthesia was also reviewed. The risks and benefits                         of the procedure and the sedation options and risks                         were discussed with the patient. All questions were                         answered, and informed consent was obtained. Prior                         Anticoagulants: The patient has taken no previous                         anticoagulant or antiplatelet agents. ASA Grade                         Assessment: II - A patient with mild systemic disease.                         After reviewing the risks and benefits, the patient                         was deemed in satisfactory condition to undergo the                         procedure.                        After obtaining informed consent, the endoscope was  passed under direct vision. Throughout the procedure,                         the patient's blood pressure, pulse, and oxygen                         saturations were monitored continuously. The Endoscope                         was introduced through the mouth, and advanced  to the                         second part of duodenum. The upper GI endoscopy was                         accomplished without difficulty. The patient tolerated                         the procedure well. Findings:      The examined esophagus was normal.      Localized mild inflammation characterized by erosions was found in the       gastric fundus and in the gastric antrum. Biopsies were taken with a       cold forceps for histology. Biopsies were taken with a cold forceps for       histology.      The examined duodenum was normal. Impression:            - Normal esophagus.                        - Gastritis. Biopsied.                        - Normal examined duodenum. Recommendation:        - Discharge patient to home.                        - Resume previous diet.                        - Continue present medications.                        - Await pathology results. Procedure Code(s):     --- Professional ---                        [phone], Esophagogastroduodenoscopy, flexible,                         transoral; with biopsy, single or multiple Diagnosis Code(s):     --- Professional ---                        R10.13, Epigastric pain                        K29.70, Gastritis, unspecified, without bleeding CPT copyright 2019 American Medical Association. All rights reserved. The codes documented in this report are preliminary and upon coder review may  be revised to meet current compliance requirements. Lucilla Lame MD, MD 08/12/2020 10:23:14 AM This report  has been signed electronically. Number of Addenda: 0 Note Initiated On: 08/12/2020 10:12 AM Total Procedure Duration: 0 hours 3 minutes 21 seconds  Estimated Blood Loss:  Estimated blood loss: none.      [facility]

## 2020-08-12 NOTE — Op Note (Signed)
[facility] Gastroenterology Patient Name: Randall Cole Procedure Date: 08/12/2020 10:06 AM MRN: [id number] Account #: [id number] Date of Birth: 07-Dec-1940 Admit Type: Outpatient Age: 80 Room: [facility] OR ROOM 01 Gender: Male Note Status: Finalized Procedure:             Colonoscopy Indications:           Weight loss Providers:             Lucilla Lame MD, MD Referring MD:          Rusty Aus, MD (Referring MD) Medicines:             Propofol per Anesthesia Complications:         No immediate complications. Procedure:             Pre-Anesthesia Assessment:                        - Prior to the procedure, a History and Physical was                         performed, and patient medications and allergies were                         reviewed. The patient's tolerance of previous                         anesthesia was also reviewed. The risks and benefits                         of the procedure and the sedation options and risks                         were discussed with the patient. All questions were                         answered, and informed consent was obtained. Prior                         Anticoagulants: The patient has taken no previous                         anticoagulant or antiplatelet agents. ASA Grade                         Assessment: II - A patient with mild systemic disease.                         After reviewing the risks and benefits, the patient                         was deemed in satisfactory condition to undergo the                         procedure.                        After obtaining informed consent, the colonoscope was  passed under direct vision. Throughout the procedure,                         the patient's blood pressure, pulse, and oxygen                         saturations were monitored continuously. The was                         introduced through the anus and advanced to the the                          cecum, identified by appendiceal orifice and ileocecal                         valve. The colonoscopy was performed without                         difficulty. The patient tolerated the procedure well.                         The quality of the bowel preparation was excellent. Findings:      The perianal and digital rectal examinations were normal.      Non-bleeding internal hemorrhoids were found during retroflexion. The       hemorrhoids were Grade II (internal hemorrhoids that prolapse but reduce       spontaneously). Impression:            - Non-bleeding internal hemorrhoids.                        - No specimens collected. Recommendation:        - Discharge patient to home.                        - Resume previous diet.                        - Continue present medications. Procedure Code(s):     --- Professional ---                        [phone], Colonoscopy, flexible; diagnostic, including                         collection of specimen(s) by brushing or washing, when                         performed (separate procedure) Diagnosis Code(s):     --- Professional ---                        R63.4, Abnormal weight loss CPT copyright 2019 American Medical Association. All rights reserved. The codes documented in this report are preliminary and upon coder review may  be revised to meet current compliance requirements. Lucilla Lame MD, MD 08/12/2020 10:40:33 AM This report has been signed electronically. Number of Addenda: 0 Note Initiated On: 08/12/2020 10:06 AM Scope Withdrawal Time: 0 hours 6 minutes 49 seconds  Total Procedure Duration: 0 hours 13 minutes 3 seconds  Estimated Blood Loss:  Estimated blood loss: none.  [facility]

## 2020-08-12 NOTE — Anesthesia Procedure Notes (Signed)
Procedure Name: MAC Date/Time: 08/12/2020 10:15 AM Performed by: Vanetta Shawl, CRNA Pre-anesthesia Checklist: Patient identified, Emergency Drugs available, Suction available, Timeout performed and Patient being monitored Patient Re-evaluated:Patient Re-evaluated prior to induction Oxygen Delivery Method: Nasal cannula Placement Confirmation: positive ETCO2

## 2020-08-15 ENCOUNTER — Encounter: Payer: Self-pay | Admitting: Gastroenterology

## 2020-08-15 ENCOUNTER — Encounter: Payer: Self-pay | Admitting: Internal Medicine

## 2020-08-15 LAB — SURGICAL PATHOLOGY

## 2020-08-16 ENCOUNTER — Encounter: Payer: Self-pay | Admitting: Gastroenterology

## 2020-09-01 ENCOUNTER — Telehealth: Payer: Self-pay | Admitting: Gastroenterology

## 2020-09-01 NOTE — Telephone Encounter (Signed)
LVM stating he is still having abdominal pain and bloating after eating a meal. What can he do, please advise.

## 2020-09-01 NOTE — Telephone Encounter (Signed)
Let the patient know that his upper and lower procedures did not show any cause for his symptoms and since it is after he eats it is likely something he is eating.  We can rule out any small bowel pathology by getting a small bowel follow-through.  This will let us know if he is having any sign of impedance of the food going down the intestines.

## 2020-09-06 ENCOUNTER — Other Ambulatory Visit: Payer: Self-pay

## 2020-09-06 DIAGNOSIS — R1012 Left upper quadrant pain: Secondary | ICD-10-CM

## 2020-09-06 DIAGNOSIS — R140 Abdominal distension (gaseous): Secondary | ICD-10-CM

## 2020-09-06 DIAGNOSIS — H353221 Exudative age-related macular degeneration, left eye, with active choroidal neovascularization: Secondary | ICD-10-CM | POA: Diagnosis not present

## 2020-09-06 NOTE — Telephone Encounter (Signed)
I am not sure what he was reading so I think he should ask him what tests he is alluding to?

## 2020-09-06 NOTE — Telephone Encounter (Signed)
Pt would like to know if you can check his pancreatic enzymes. He stated he has been reading up on some things and he is requesting this to be done. Is that the fecal pancreatic elastase testing?

## 2020-09-07 ENCOUNTER — Other Ambulatory Visit: Payer: Self-pay

## 2020-09-07 DIAGNOSIS — R140 Abdominal distension (gaseous): Secondary | ICD-10-CM

## 2020-09-07 NOTE — Telephone Encounter (Signed)
Fecal pancreatic enzyme lab test ordered for pt. He will stop by tomorrow afternoon to pick up container.

## 2020-09-11 DIAGNOSIS — R140 Abdominal distension (gaseous): Secondary | ICD-10-CM | POA: Diagnosis not present

## 2020-09-17 LAB — PANCREATIC ELASTASE, FECAL: Pancreatic Elastase, Fecal: 120 ug Elast./g — ABNORMAL LOW (ref >200)

## 2020-09-19 ENCOUNTER — Other Ambulatory Visit: Payer: Self-pay

## 2020-09-20 ENCOUNTER — Telehealth: Payer: Self-pay

## 2020-09-20 MED ORDER — PANCRELIPASE (LIP-PROT-AMYL) 36000-114000 UNITS PO CPEP: ORAL_CAPSULE | ORAL | 0 refills | Status: DC

## 2020-09-20 NOTE — Telephone Encounter (Signed)
Pt's wife notified of lab results. Samples of Creon has been offered to the pt to try prior to a prescription being written.

## 2020-09-20 NOTE — Telephone Encounter (Signed)
-----   Message from Lucilla Lame, MD sent at 09/19/2020 11:50 AM EDT ----- Let the patient know that his pancreatic test showed some moderate dysfunction.  He should be started on pancreatic enzymes to see how he does.

## 2020-09-27 ENCOUNTER — Telehealth: Payer: Self-pay | Admitting: Gastroenterology

## 2020-09-27 NOTE — Telephone Encounter (Signed)
LVM for pt to return my call.

## 2020-09-27 NOTE — Telephone Encounter (Signed)
Patient lvm that he is having a problem and needs a call back.

## 2020-10-03 NOTE — Telephone Encounter (Signed)
Pt returned my call and samples for Creon were put up front for pickup.

## 2020-10-05 DIAGNOSIS — G5603 Carpal tunnel syndrome, bilateral upper limbs: Secondary | ICD-10-CM | POA: Diagnosis not present

## 2020-10-05 DIAGNOSIS — I700 Atherosclerosis of aorta: Secondary | ICD-10-CM | POA: Diagnosis not present

## 2020-10-07 ENCOUNTER — Other Ambulatory Visit: Payer: Self-pay

## 2020-10-07 ENCOUNTER — Telehealth: Payer: Self-pay

## 2020-10-07 MED ORDER — PANCRELIPASE (LIP-PROT-AMYL) 36000-114000 UNITS PO CPEP: ORAL_CAPSULE | ORAL | 6 refills | Status: DC

## 2020-10-07 NOTE — Telephone Encounter (Signed)
Patient left a voicemail for call back

## 2020-10-12 NOTE — Telephone Encounter (Signed)
Returned pt's call regarding a refill on the Creon medication. Prescription has been sent to pt's pharmacy.

## 2020-10-25 DIAGNOSIS — Z125 Encounter for screening for malignant neoplasm of prostate: Secondary | ICD-10-CM | POA: Diagnosis not present

## 2020-10-25 DIAGNOSIS — E538 Deficiency of other specified B group vitamins: Secondary | ICD-10-CM | POA: Diagnosis not present

## 2020-10-25 DIAGNOSIS — E782 Mixed hyperlipidemia: Secondary | ICD-10-CM | POA: Diagnosis not present

## 2020-10-28 ENCOUNTER — Other Ambulatory Visit: Payer: Self-pay | Admitting: Surgery

## 2020-11-01 DIAGNOSIS — Z125 Encounter for screening for malignant neoplasm of prostate: Secondary | ICD-10-CM | POA: Diagnosis not present

## 2020-11-01 DIAGNOSIS — H353221 Exudative age-related macular degeneration, left eye, with active choroidal neovascularization: Secondary | ICD-10-CM | POA: Diagnosis not present

## 2020-11-01 DIAGNOSIS — I700 Atherosclerosis of aorta: Secondary | ICD-10-CM | POA: Diagnosis not present

## 2020-11-01 DIAGNOSIS — E782 Mixed hyperlipidemia: Secondary | ICD-10-CM | POA: Diagnosis not present

## 2020-11-01 DIAGNOSIS — H353220 Exudative age-related macular degeneration, left eye, stage unspecified: Secondary | ICD-10-CM | POA: Diagnosis not present

## 2020-11-01 DIAGNOSIS — E538 Deficiency of other specified B group vitamins: Secondary | ICD-10-CM | POA: Diagnosis not present

## 2020-11-01 DIAGNOSIS — Z0000 Encounter for general adult medical examination without abnormal findings: Secondary | ICD-10-CM | POA: Diagnosis not present

## 2020-11-08 ENCOUNTER — Other Ambulatory Visit: Payer: Self-pay

## 2020-11-08 ENCOUNTER — Encounter: Payer: Self-pay | Admitting: Gastroenterology

## 2020-11-08 ENCOUNTER — Ambulatory Visit: Payer: PPO | Admitting: Gastroenterology

## 2020-11-08 VITALS — BP 148/80 | HR 72 | Ht 69.5 in | Wt 155.4 lb

## 2020-11-08 DIAGNOSIS — K8689 Other specified diseases of pancreas: Secondary | ICD-10-CM

## 2020-11-08 NOTE — Progress Notes (Signed)
Primary Care Physician: Rusty Aus, MD  Primary Gastroenterologist:  Dr. Lucilla Lame  Chief Complaint  Patient presents with   Follow-up    Pancreatic insufficiency    HPI: Randall Cole is a 80 y.o. 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  The patient had called the office and wanted a referral to a dietitian for a diet for his exocrine pancreatic insufficiency.  The patient was also started on pancreatic enzymes after his diagnosis was made. The patient reports that after losing some weight he has now leveled off and gained some weight back.  The patient also reports that he has stopped milk products and his diarrhea and bloating have improved.  The patient is concerned that he may not be absorbing his nutrients despite feeling better.  He states that this is a concern because of what he has read about pancreatic insufficiency.  The patient also talks about seeing a nutritionist for guidance on what he can and cannot eat.  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     ascorbic acid (VITAMIN C) 500 MG tablet Take by mouth.     aspirin 81 MG EC tablet Take by mouth.     cyanocobalamin 1000 MCG tablet Take by mouth.     doxazosin (CARDURA) 2 MG tablet SMARTSIG:1 Tablet(s) By Mouth Every Evening     finasteride (PROSCAR) 5 MG tablet Take by mouth.     lipase/protease/amylase (CREON) 36000 UNITS CPEP capsule Take 2 capsules (72,000 Units total) by mouth 3 (three) times daily with meals. May also take 1 capsule (36,000 Units total) as needed (with snacks). 210 capsule 6   meloxicam (MOBIC) 15 MG tablet Take 15 mg by mouth daily.     metoprolol tartrate (LOPRESSOR) 25 MG tablet       Multiple Vitamins-Minerals (VISION-VITE PRESERVE PO) Take by mouth.     pyridOXINE (VITAMIN B-6) 25 MG tablet Take by mouth.     Saw Palmetto 500 MG CAPS Take by mouth.     Sod Picosulfate-Mag Ox-Cit Acd (CLENPIQ) 10-3.5-12 MG-GM -GM/160ML SOLN Take 320 mLs by mouth as directed. 320 mL 0   Turmeric (QC TUMERIC COMPLEX PO) Take by mouth.     vitamin E 1000 UNIT capsule Take 1,000 Units by mouth daily.     Zinc 100 MG TABS Take by mouth.     hyoscyamine (LEVSIN SL) 0.125 MG SL tablet Place under the tongue every 4 (four) hours as needed. (Patient not taking: No sig reported)     meclizine (ANTIVERT) 25 MG tablet Take 1 tablet (25 mg total) by mouth 3 (three) times daily as needed for dizziness. (Patient not taking: No sig reported) 30 tablet 0   pantoprazole (PROTONIX) 40 MG tablet  (Patient not taking: No sig reported)     terbinafine (LAMISIL) 250 MG tablet TAKE 1 TABLET BY MOUTH EVERY DAY (Patient not taking: No sig reported) 30 tablet 0   No current facility-administered medications for this visit.    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 ENT: Negative for hoarseness, difficulty swallowing , nasal congestion. CV: Negative for chest pain, angina, palpitations, dyspnea on exertion, peripheral edema.  Respiratory: Negative for dyspnea at rest, dyspnea on exertion, cough, sputum, wheezing.  GI: See history of present illness. GU:  Negative for dysuria, hematuria, urinary incontinence, urinary frequency, nocturnal urination.  Endo: Negative for unusual weight change.    Physical Examination:   BP (!) 148/80 (BP Location: Right Arm, Patient Position: Sitting, Cuff Size: Normal)   Pulse 72   Ht 5' 9.5"  (1.765 m)   Wt 155 lb 6.4 oz (70.5 kg)   BMI 22.62 kg/m   General: Well-nourished, well-developed in no acute distress.  Eyes: No icterus. Conjunctivae pink. Neuro: Alert and oriented x 3.  Grossly intact. Skin: Warm and dry, no jaundice.   Psych: Alert and cooperative, normal mood and affect.  Labs:    Imaging Studies: No results found.  Assessment and Plan:   Randall Cole is a 80 y.o. y/o male who comes in today with a history of diarrhea bloating and abdominal discomfort with weight loss who was found to have exocrine pancreatic insufficiency.  The patient is presently taking pancreatic enzyme replacement.  He came today with multiple questions about lifestyle and diet.  We discussed in depth his nutritional status and how his weight has stayed stable and he is actually gained some weight.  The patient will be referred to a dietitian to further discuss dietary planning for him.  The patient has been explained the plan agrees with it.     Lucilla Lame, MD. [facility]    Note: This dictation was prepared with Dragon dictation along with smaller phrase technology. Any transcriptional errors that result from this process are unintentional.

## 2020-11-11 DIAGNOSIS — G5601 Carpal tunnel syndrome, right upper limb: Secondary | ICD-10-CM | POA: Diagnosis not present

## 2020-11-16 ENCOUNTER — Other Ambulatory Visit
Admission: RE | Admit: 2020-11-16 | Discharge: 2020-11-16 | Disposition: A | Discharge status: Home / Self Care | Payer: PPO | Source: Ambulatory Visit | Attending: Surgery | Admitting: Surgery

## 2020-11-16 ENCOUNTER — Other Ambulatory Visit: Payer: Self-pay

## 2020-11-16 HISTORY — DX: Degenerative myopia with macular hole, unspecified eye: H44.2B9

## 2020-11-16 NOTE — Patient Instructions (Signed)
Your procedure is scheduled on: Wednesday November 23, 2020. Report to Day Surgery inside [facility] 2nd floor (stop by admissions desk first before getting on elevator). To find out your arrival time please call [phone] between 1PM - 3PM on Tuesday November 22, 2020.  Remember: Instructions that are not followed completely may result in serious medical risk,  up to and including death, or upon the discretion of your surgeon and anesthesiologist your  surgery may need to be rescheduled.     _X__ 1. Do not eat food after midnight the night before your procedure.                 No chewing gum or hard candies. You may drink clear liquids up to 2 hours                 before you are scheduled to arrive for your surgery- DO not drink clear                 liquids within 2 hours of the start of your surgery.                 Clear Liquids include:  water, apple juice without pulp, clear Gatorade, G2 or                  Gatorade Zero (avoid Red/Purple/Blue), Black Coffee or Tea (Do not add                 anything to coffee or tea).  __X__2.   Complete the "Ensure Clear Pre-surgery Clear Carbohydrate Drink" provided to you, 2 hours before arrival. **If you are diabetic you will be provided with an alternative drink, Gatorade Zero or G2.  __X__3.  On the morning of surgery brush your teeth with toothpaste and water, you                may rinse your mouth with mouthwash if you wish.  Do not swallow any toothpaste of mouthwash.     _X__ 4.  No Alcohol for 24 hours before or after surgery.   _X__ 5.  Do Not Smoke or use e-cigarettes For 24 Hours Prior to Your Surgery.                 Do not use any chewable tobacco products for at least 6 hours prior to                 Surgery.  _X__  6.  Do not use any recreational drugs (marijuana, cocaine, heroin, ecstasy, MDMA or other)                For at least one week prior to your surgery.  Combination of these drugs with  anesthesia                May have life threatening results.  __X__7.  Notify your doctor if there is any change in your medical condition      (cold, fever, infections).     Do not wear jewelry, make-up, hairpins, clips or nail polish. Do not wear lotions, powders, or perfumes deodorant. Do not shave 48 hours prior to surgery. Men may shave face and neck. Do not bring valuables to the hospital.    [facility] is not responsible for any belongings or valuables.  Contacts, dentures or bridgework may not be worn into surgery. Leave your suitcase in the car. After surgery it may be brought  to your room. For patients admitted to the hospital, discharge time is determined by your treatment team.   Patients discharged the day of surgery will not be allowed to drive home.   Make arrangements for someone to be with you for the first 24 hours of your Same Day Discharge.   __X__ Take these medicines the morning of surgery with A SIP OF WATER:    1. amLODipine (NORVASC) 10 MG  2. finasteride (PROSCAR) 5 MG  3.   4.  5.  6.  ____ Fleet Enema (as directed)   __X__ Use CHG Soap (or wipes) as directed  ____ Use Benzoyl Peroxide Gel as instructed  ____ Use inhalers on the day of surgery  ____ Stop metformin 2 days prior to surgery    ____ Take 1/2 of usual insulin dose the night before surgery. No insulin the morning          of surgery.   ____ Call your PCP, cardiologist, or Pulmonologist if taking Coumadin/Plavix/aspirin and ask when to stop before your surgery.   __X__ One Week prior to surgery- Stop Anti-inflammatories such as Ibuprofen, Aleve, Advil, Motrin, meloxicam (MOBIC), diclofenac, etodolac, ketorolac, Toradol, Daypro, piroxicam, Goody's or BC powders. OK TO USE TYLENOL IF NEEDED   __X__ Stop supplements until after surgery. Turmeric, vitamin C and VITAMIN E   ____ Bring C-Pap to the hospital.    If you have any questions regarding your pre-procedure instructions,   Please call Pre-admit Testing at [phone].

## 2020-11-21 ENCOUNTER — Other Ambulatory Visit
Admission: RE | Admit: 2020-11-21 | Discharge: 2020-11-21 | Disposition: A | Discharge status: Home / Self Care | Payer: PPO | Source: Ambulatory Visit | Attending: Surgery | Admitting: Surgery

## 2020-11-21 ENCOUNTER — Other Ambulatory Visit: Payer: Self-pay

## 2020-11-21 DIAGNOSIS — Z01818 Encounter for other preprocedural examination: Secondary | ICD-10-CM | POA: Insufficient documentation

## 2020-11-21 DIAGNOSIS — I10 Essential (primary) hypertension: Secondary | ICD-10-CM | POA: Insufficient documentation

## 2020-11-23 ENCOUNTER — Ambulatory Visit
Admission: RE | Admit: 2020-11-23 | Discharge: 2020-11-23 | Disposition: A | Discharge status: Home / Self Care | Payer: PPO | Attending: Surgery | Admitting: Surgery

## 2020-11-23 ENCOUNTER — Encounter: Payer: Self-pay | Admitting: Surgery

## 2020-11-23 ENCOUNTER — Encounter
Admission: RE | Disposition: A | Discharge status: Home / Self Care | Payer: Self-pay | Source: Home / Self Care | Attending: Surgery

## 2020-11-23 ENCOUNTER — Ambulatory Visit: Payer: PPO | Admitting: Certified Registered Nurse Anesthetist

## 2020-11-23 DIAGNOSIS — Z85828 Personal history of other malignant neoplasm of skin: Secondary | ICD-10-CM | POA: Insufficient documentation

## 2020-11-23 DIAGNOSIS — Z791 Long term (current) use of non-steroidal anti-inflammatories (NSAID): Secondary | ICD-10-CM | POA: Insufficient documentation

## 2020-11-23 DIAGNOSIS — I10 Essential (primary) hypertension: Secondary | ICD-10-CM | POA: Diagnosis not present

## 2020-11-23 DIAGNOSIS — G5603 Carpal tunnel syndrome, bilateral upper limbs: Secondary | ICD-10-CM | POA: Insufficient documentation

## 2020-11-23 DIAGNOSIS — Z79899 Other long term (current) drug therapy: Secondary | ICD-10-CM | POA: Insufficient documentation

## 2020-11-23 DIAGNOSIS — G5601 Carpal tunnel syndrome, right upper limb: Secondary | ICD-10-CM | POA: Diagnosis not present

## 2020-11-23 HISTORY — PX: CARPAL TUNNEL RELEASE: SHX101

## 2020-11-23 SURGERY — RELEASE, CARPAL TUNNEL, ENDOSCOPIC
Anesthesia: General | Site: Wrist | Laterality: Right

## 2020-11-23 MED ORDER — FENTANYL CITRATE (PF) 100 MCG/2ML IJ SOLN
INTRAMUSCULAR | Status: AC
  Filled 2020-11-23: qty 2

## 2020-11-23 MED ORDER — FAMOTIDINE 20 MG PO TABS
20.0000 mg | ORAL_TABLET | Freq: Once | ORAL | Status: AC
  Administered 2020-11-23: 20 mg via ORAL

## 2020-11-23 MED ORDER — FENTANYL CITRATE (PF) 100 MCG/2ML IJ SOLN
INTRAMUSCULAR | Status: DC | PRN
  Administered 2020-11-23 (×2): 25 ug via INTRAVENOUS

## 2020-11-23 MED ORDER — PHENYLEPHRINE HCL (PRESSORS) 10 MG/ML IV SOLN
INTRAVENOUS | Status: DC | PRN
  Administered 2020-11-23 (×3): 50 ug via INTRAVENOUS

## 2020-11-23 MED ORDER — ACETAMINOPHEN 10 MG/ML IV SOLN
INTRAVENOUS | Status: DC | PRN
  Administered 2020-11-23: 1000 mg via INTRAVENOUS

## 2020-11-23 MED ORDER — CEFAZOLIN SODIUM-DEXTROSE 2-4 GM/100ML-% IV SOLN
2.0000 g | INTRAVENOUS | Status: AC
  Administered 2020-11-23: 2 g via INTRAVENOUS

## 2020-11-23 MED ORDER — 0.9 % SODIUM CHLORIDE (POUR BTL) OPTIME
TOPICAL | Status: DC | PRN
  Administered 2020-11-23: 500 mL

## 2020-11-23 MED ORDER — PROPOFOL 10 MG/ML IV BOLUS
INTRAVENOUS | Status: DC | PRN
  Administered 2020-11-23: 130 mg via INTRAVENOUS
  Administered 2020-11-23: 20 mg via INTRAVENOUS

## 2020-11-23 MED ORDER — ACETAMINOPHEN 10 MG/ML IV SOLN
INTRAVENOUS | Status: AC
  Filled 2020-11-23: qty 100

## 2020-11-23 MED ORDER — CHLORHEXIDINE GLUCONATE 0.12 % MT SOLN
OROMUCOSAL | Status: AC
  Filled 2020-11-23: qty 15

## 2020-11-23 MED ORDER — LACTATED RINGERS IV SOLN: INTRAVENOUS | Status: DC

## 2020-11-23 MED ORDER — CEFAZOLIN SODIUM-DEXTROSE 2-4 GM/100ML-% IV SOLN
INTRAVENOUS | Status: AC
  Filled 2020-11-23: qty 100

## 2020-11-23 MED ORDER — CHLORHEXIDINE GLUCONATE 0.12 % MT SOLN
15.0000 mL | Freq: Once | OROMUCOSAL | Status: AC
  Administered 2020-11-23: 15 mL via OROMUCOSAL

## 2020-11-23 MED ORDER — DEXAMETHASONE SODIUM PHOSPHATE 10 MG/ML IJ SOLN
INTRAMUSCULAR | Status: DC | PRN
  Administered 2020-11-23: 10 mg via INTRAVENOUS

## 2020-11-23 MED ORDER — FENTANYL CITRATE (PF) 100 MCG/2ML IJ SOLN: 25.0000 ug | INTRAMUSCULAR | Status: DC | PRN

## 2020-11-23 MED ORDER — FAMOTIDINE 20 MG PO TABS
ORAL_TABLET | ORAL | Status: AC
  Filled 2020-11-23: qty 1

## 2020-11-23 MED ORDER — BUPIVACAINE HCL (PF) 0.5 % IJ SOLN
INTRAMUSCULAR | Status: DC | PRN
  Administered 2020-11-23: 10 mL

## 2020-11-23 MED ORDER — LIDOCAINE HCL (CARDIAC) PF 100 MG/5ML IV SOSY
PREFILLED_SYRINGE | INTRAVENOUS | Status: DC | PRN
  Administered 2020-11-23: 80 mg via INTRAVENOUS

## 2020-11-23 MED ORDER — GLYCOPYRROLATE 0.2 MG/ML IJ SOLN
INTRAMUSCULAR | Status: DC | PRN
  Administered 2020-11-23: .2 mg via INTRAVENOUS

## 2020-11-23 MED ORDER — ORAL CARE MOUTH RINSE: 15.0000 mL | Freq: Once | OROMUCOSAL | Status: AC

## 2020-11-23 MED ORDER — LACTATED RINGERS IV SOLN: INTRAVENOUS | Status: DC | PRN

## 2020-11-23 MED ORDER — ONDANSETRON HCL 4 MG/2ML IJ SOLN
INTRAMUSCULAR | Status: DC | PRN
  Administered 2020-11-23: 4 mg via INTRAVENOUS

## 2020-11-23 MED ORDER — EPHEDRINE SULFATE 50 MG/ML IJ SOLN
INTRAMUSCULAR | Status: DC | PRN
  Administered 2020-11-23: 5 mg via INTRAVENOUS
  Administered 2020-11-23 (×3): 10 mg via INTRAVENOUS

## 2020-11-23 SURGICAL SUPPLY — 29 items
APL PRP STRL LF DISP 70% ISPRP (MISCELLANEOUS) ×1
BNDG COHESIVE 4X5 TAN STRL (GAUZE/BANDAGES/DRESSINGS) ×2 IMPLANT
BNDG ELASTIC 2X5.8 VLCR STR LF (GAUZE/BANDAGES/DRESSINGS) ×2 IMPLANT
BNDG ESMARK 4X12 TAN STRL LF (GAUZE/BANDAGES/DRESSINGS) ×2 IMPLANT
CHLORAPREP W/TINT 26 (MISCELLANEOUS) ×2 IMPLANT
CORD BIP STRL DISP 12FT (MISCELLANEOUS) ×2 IMPLANT
CUFF TOURN SGL QUICK 18X4 (TOURNIQUET CUFF) ×2 IMPLANT
DRAPE SURG 17X11 SM STRL (DRAPES) ×2 IMPLANT
FORCEPS JEWEL BIP 4-3/4 STR (INSTRUMENTS) ×2 IMPLANT
GAUZE 4X4 16PLY ~~LOC~~+RFID DBL (SPONGE) ×2 IMPLANT
GAUZE SPONGE 4X4 12PLY STRL (GAUZE/BANDAGES/DRESSINGS) ×2 IMPLANT
GAUZE XEROFORM 1X8 LF (GAUZE/BANDAGES/DRESSINGS) ×2 IMPLANT
GLOVE SURG ENC MOIS LTX SZ8 (GLOVE) ×4 IMPLANT
GLOVE SURG UNDER LTX SZ8 (GLOVE) ×4 IMPLANT
GOWN STRL REUS W/ TWL LRG LVL3 (GOWN DISPOSABLE) ×1 IMPLANT
GOWN STRL REUS W/ TWL XL LVL3 (GOWN DISPOSABLE) ×1 IMPLANT
GOWN STRL REUS W/TWL LRG LVL3 (GOWN DISPOSABLE) ×2
GOWN STRL REUS W/TWL XL LVL3 (GOWN DISPOSABLE) ×4
KIT CARPAL TUNNEL (MISCELLANEOUS) ×2
KIT ESCP INSRT D SLOT CANN KN (MISCELLANEOUS) ×1 IMPLANT
KIT TURNOVER KIT A (KITS) ×2 IMPLANT
MANIFOLD NEPTUNE II (INSTRUMENTS) ×2 IMPLANT
NS IRRIG 500ML POUR BTL (IV SOLUTION) ×2 IMPLANT
PACK EXTREMITY ARMC (MISCELLANEOUS) ×2 IMPLANT
SPLINT WRIST LG RT TX900304 (SOFTGOODS) IMPLANT
SPLINT WRIST M RT TX990303 (SOFTGOODS) IMPLANT
SPLINT WRIST XL RT TX990305 (SOFTGOODS) ×1 IMPLANT
STOCKINETTE IMPERVIOUS 9X36 MD (GAUZE/BANDAGES/DRESSINGS) ×2 IMPLANT
SUT PROLENE 4 0 PS 2 18 (SUTURE) ×2 IMPLANT

## 2020-11-23 NOTE — H&P (Signed)
History of Present Illness:  Randall Cole is a 80 y.o. male who has been referred by Dr. Rudene Christians for bilateral carpal tunnel syndrome, right more symptomatic than left. Dr. Rudene Christians had seen this patient in May, 2022. An EMG/NCV study performed in December, 2021 confirmed the presence of severe right carpal tunnel syndrome as well as moderate left carpal tunnel syndrome without ulnar neuropathy. The patient notes that his symptoms have been present for 3 to 4 years but have been worsening, prompting him to seek further evaluation and treatment. Initially, he was seen at [facility] where the EMG study was performed. He has been wearing a Velcro splint which has been of some benefit. He also has been taking meloxicam on a daily basis with temporary partial relief of his symptoms. However, his right hand continues to be numb. He saw Dr. Rudene Christians who offered him a carpal tunnel release, but the patient has requested that the carpal tunnel release to be performed endoscopically, prompting Dr. Rudene Christians to refer the patient to me.  Current Outpatient Medications:  amLODIPine (NORVASC) 10 MG tablet Take 1 tablet (10 mg total) by mouth once daily 90 tablet 3   ascorbic acid (VITAMIN C) 500 MG tablet Take 500 mg by mouth once daily.   B-complex with vitamin C (VITAMIN B COMPLEX-C ORAL) Take by mouth once daily Twice a week   cyanocobalamin (VITAMIN B12) 1000 MCG tablet Take 1,000 mcg by mouth once daily Twice a week   doxazosin (CARDURA) 2 MG tablet Take 1 tablet (2 mg total) by mouth nightly 90 tablet 3   finasteride (PROSCAR) 5 mg tablet Take 1 tablet (5 mg total) by mouth once daily 90 tablet 3   metoprolol tartrate (LOPRESSOR) 25 MG tablet Take 1 tablet (25 mg total) by mouth once daily 90 tablet 3   multivitamin tablet Take 1 tablet by mouth once daily   pancrelipase (CREON) 36,000-114,000-180,000 unit DR capsule Take 2 capsules by mouth 3 (three) times daily   pyridoxine, vitamin B6, (VITAMIN B-6) 25 MG tablet  Take by mouth Twice a week   vit A/vit C/vit E/zinc/copper (PRESERVISION AREDS ORAL) Take by mouth Per patient   vitamin E 1000 UNIT capsule Take 1 capsule by mouth once daily   Allergies:   Neutral Red Other (See Comments)  Other reaction(s): OTHER   Statins-Hmg-Coa Reductase Inhibitors Muscle Pain   Red Yeast Rice ([place] Purpureus) Nausea And Vomiting   Past Medical History:   BPH (benign prostatic hypertrophy)   History of shingles   Hyperlipidemia 10/11/2013   Hypertension   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)   Past Surgical History:   CATARACT EXTRACTION leading to detached retinal repair.   GI PROSTATE BIOPSY - Negative   HERNIA REPAIR   Family History   Coronary Artery Disease - Mother (s/p CABG)   Deep vein thrombosis (DVT or abnormal blood clot formation) Mother   High blood pressure (Hypertension) Father   No Known Problems Brother   No Known Problems Daughter   No Known Problems Son   Social History:   Socioeconomic History:   Marital status: Married  Tobacco Use   Smoking status: Never Smoker   Smokeless tobacco: Never Used  Substance and Sexual Activity   Alcohol use: No   Sexual activity: Yes  Social History Narrative  Retired from [employer] and from Press [employer]  Married -  2 children - daughter an Therapist, sports at [employer], son is with Community education officer  2 grandchildren  Tobacco - none  Etoh - occas  Hobbies - bass fishes, make wine from grapes   Review of Systems:  A comprehensive 14 point ROS was performed, reviewed, and the pertinent orthopaedic findings are documented in the HPI.  Physical Exam: Vitals:  11/11/20 1010  BP: (!) 140/80  Weight: 70.9 kg (156 lb 6.4 oz)  Height: 172.7 cm (5\' 8" )  PainSc: 0-No pain  PainLoc: Hand   General/Constitutional: The patient appears to be well-nourished, well-developed, and in no acute distress. Neuro/Psych: Normal mood and affect, oriented to person, place and  time. Eyes: Non-icteric. Pupils are equal, round, and reactive to light, and exhibit synchronous movement. ENT: Unremarkable. Lymphatic: No palpable adenopathy. Respiratory: Lungs clear to auscultation, Normal chest excursion, No wheezes and Non-labored breathing Cardiovascular: Regular rate and rhythm. No murmurs. and No edema, swelling or tenderness, except as noted in detailed exam. Integumentary: No impressive skin lesions present, except as noted in detailed exam. Musculoskeletal: Unremarkable, except as noted in detailed exam.  Right hand/wrist exam: Skin inspection of the right hand and wrist is unremarkable. No swelling, erythema, ecchymosis, abrasions, or other skin abnormalities are identified. He has no tenderness to palpation over the dorsal or palmar aspects of the wrist or hand. He exhibits full active and passive range of motion of the wrist without discomfort. He also can actively flex and extend all digits fully without any pain or triggering. He is neurovascularly intact to all digits. He has a negative Phalen's test and a negative Tinel's over the carpal tunnel.  EMG results:  The results of a recent EMG are available for review and have been reviewed by myself. The findings are as described above.  Assessment: Carpal tunnel syndrome, right.   Plan: The treatment options were discussed with the patient. In addition, patient educational materials were provided regarding the diagnosis and treatment options. The patient is frustrated by his symptoms and functional limitations, and is ready to consider more aggressive treatment options. Therefore, I have recommended a surgical procedure, specifically an endoscopic right carpal tunnel release. The procedure was discussed with the patient, as were the potential risks (including bleeding, infection, nerve and/or blood vessel injury, persistent or recurrent pain/paresthesias, weakness of grip, need for further surgery, blood clots,  strokes, heart attacks and/or arhythmias, pneumonia, etc.) and benefits. The patient states his understanding and wishes to proceed. All of the patient's questions and concerns were answered. He can call any time with further concerns. He will follow up post-surgery, routine.   H&P reviewed and patient re-examined. No changes.

## 2020-11-23 NOTE — Discharge Instructions (Addendum)
Orthopedic discharge instructions: Keep dressing dry and intact. Keep hand elevated above heart level. May shower after dressing removed on postop day 4 (Sunday). Cover sutures with Band-Aids after drying off, then reapply Velcro splint. Apply ice to affected area frequently. Take ibuprofen 600-800 mg TID with meals for 5-7 days, then as necessary. Take ES Tylenol if/when needed.  Return for follow-up in 10-14 days or as scheduled.    AMBULATORY SURGERY  DISCHARGE INSTRUCTIONS   The drugs that you were given will stay in your system until tomorrow so for the next 24 hours you should not:  Drive an automobile Make any legal decisions Drink any alcoholic beverage   You may resume regular meals tomorrow.  Today it is better to start with liquids and gradually work up to solid foods.  You may eat anything you prefer, but it is better to start with liquids, then soup and crackers, and gradually work up to solid foods.   Please notify your doctor immediately if you have any unusual bleeding, trouble breathing, redness and pain at the surgery site, drainage, fever, or pain not relieved by medication.    Your post-operative visit with Dr.                                       is: Date:                        Time:    Please call to schedule your post-operative visit.  Additional Instructions:

## 2020-11-23 NOTE — Anesthesia Procedure Notes (Deleted)
Procedure Name: LMA Insertion Date/Time: 11/23/2020 8:43 AM Performed by: Willette Alma, CRNA Pre-anesthesia Checklist: Patient identified, Patient being monitored, Timeout performed, Emergency Drugs available and Suction available Patient Re-evaluated:Patient Re-evaluated prior to induction Oxygen Delivery Method: Circle system utilized Preoxygenation: Pre-oxygenation with 100% oxygen Induction Type: IV induction Ventilation: Mask ventilation without difficulty LMA: LMA inserted LMA Size: 4.5 Tube type: Oral Number of attempts: 1 Placement Confirmation: positive ETCO2 and breath sounds checked- equal and bilateral Tube secured with: Tape Dental Injury: Teeth and Oropharynx as per pre-operative assessment

## 2020-11-23 NOTE — Anesthesia Procedure Notes (Signed)
Procedure Name: LMA Insertion Date/Time: 11/23/2020 8:43 AM Performed by: Willette Alma, CRNA Pre-anesthesia Checklist: Patient identified, Patient being monitored, Timeout performed, Emergency Drugs available and Suction available Patient Re-evaluated:Patient Re-evaluated prior to induction Oxygen Delivery Method: Circle system utilized Preoxygenation: Pre-oxygenation with 100% oxygen Induction Type: IV induction Ventilation: Mask ventilation without difficulty LMA: LMA inserted LMA Size: 4.5 Tube type: Oral Number of attempts: 1 Placement Confirmation: positive ETCO2 and breath sounds checked- equal and bilateral Tube secured with: Tape Dental Injury: Teeth and Oropharynx as per pre-operative assessment

## 2020-11-23 NOTE — Transfer of Care (Signed)
Immediate Anesthesia Transfer of Care Note  Patient: Mats K Gowens  Procedure(s) Performed: CARPAL TUNNEL RELEASE ENDOSCOPIC (Right: Wrist)  Patient Location: PACU  Anesthesia Type:General  Level of Consciousness: awake, alert  and oriented  Airway & Oxygen Therapy: Patient Spontanous Breathing and Patient connected to face mask oxygen  Post-op Assessment: Report given to RN and Post -op Vital signs reviewed and stable  Post vital signs: Reviewed and stable  Last Vitals:  Vitals Value Taken Time  BP 114/57 11/23/20 0930  Temp    Pulse 58 11/23/20 0933  Resp 11 11/23/20 0933  SpO2 99 % 11/23/20 0933  Vitals shown include unvalidated device data.  Last Pain:  Vitals:   11/23/20 0709  TempSrc: Temporal  PainSc: 0-No pain         Complications: No notable events documented.

## 2020-11-23 NOTE — Anesthesia Preprocedure Evaluation (Signed)
Anesthesia Evaluation  Patient identified by MRN, date of birth, ID band Patient awake    Reviewed: Allergy & Precautions, NPO status , Patient's Chart, lab work & pertinent test results  History of Anesthesia Complications Negative for: history of anesthetic complications  Airway Mallampati: III  TM Distance: <3 FB Neck ROM: limited    Dental  (+) Chipped, Poor Dentition, Missing   Pulmonary neg pulmonary ROS, neg shortness of breath,    Pulmonary exam normal        Cardiovascular Exercise Tolerance: Good hypertension, (-) anginaNormal cardiovascular exam     Neuro/Psych PSYCHIATRIC DISORDERS  Neuromuscular disease negative psych ROS   GI/Hepatic negative GI ROS, Neg liver ROS,   Endo/Other  negative endocrine ROS  Renal/GU      Musculoskeletal   Abdominal   Peds  Hematology negative hematology ROS (+)   Anesthesia Other Findings Past Medical History: No date: Carpal tunnel syndrome     Comment:  bilateral 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 No date: Hypertension 12/25/2018: Squamous cell carcinoma of skin     Comment:  mid vertex scalp/EDC  Past Surgical History: No date: COLONOSCOPY 08/12/2020: COLONOSCOPY WITH PROPOFOL; N/A     Comment:  Procedure: COLONOSCOPY WITH PROPOFOL;  Surgeon: Lucilla Lame, MD;  Location: [facility];  Service:               Endoscopy;  Laterality: N/A; 08/12/2020: ESOPHAGOGASTRODUODENOSCOPY (EGD) WITH PROPOFOL; N/A     Comment:  Procedure: ESOPHAGOGASTRODUODENOSCOPY (EGD) WITH               PROPOFOL;  Surgeon: Lucilla Lame, MD;  Location: [facility];  Service: Endoscopy;  Laterality: N/A; 2010: HERNIA REPAIR  BMI    Body Mass Index: 23.26 kg/m      Reproductive/Obstetrics negative OB ROS                             Anesthesia Physical Anesthesia Plan  ASA: 2  Anesthesia Plan: General  LMA   Post-op Pain Management:    Induction: Intravenous  PONV Risk Score and Plan: Dexamethasone, Ondansetron, Midazolam and Treatment may vary due to age or medical condition  Airway Management Planned: LMA  Additional Equipment:   Intra-op Plan:   Post-operative Plan: Extubation in OR  Informed Consent: I have reviewed the patients History and Physical, chart, labs and discussed the procedure including the risks, benefits and alternatives for the proposed anesthesia with the patient or authorized representative who has indicated his/her understanding and acceptance.     Dental Advisory Given  Plan Discussed with: Anesthesiologist, CRNA and Surgeon  Anesthesia Plan Comments: (Patient consented for risks of anesthesia including but not limited to:  - adverse reactions to medications - damage to eyes, teeth, lips or other oral mucosa - nerve damage due to positioning  - sore throat or hoarseness - Damage to heart, brain, nerves, lungs, other parts of body or loss of life  Patient voiced understanding.)        Anesthesia Quick Evaluation

## 2020-11-23 NOTE — Anesthesia Postprocedure Evaluation (Signed)
Anesthesia Post Note  Patient: Randall Cole  Procedure(s) Performed: CARPAL TUNNEL RELEASE ENDOSCOPIC (Right: Wrist)  Patient location during evaluation: PACU Anesthesia Type: General Level of consciousness: awake and alert Pain management: pain level controlled Vital Signs Assessment: post-procedure vital signs reviewed and stable Respiratory status: spontaneous breathing, nonlabored ventilation, respiratory function stable and patient connected to nasal cannula oxygen Cardiovascular status: blood pressure returned to baseline and stable Postop Assessment: no apparent nausea or vomiting Anesthetic complications: no   No notable events documented.   Last Vitals:  Vitals:   11/23/20 1000 11/23/20 1019  BP: 122/66 129/61  Pulse: (!) 58 (!) 54  Resp: 18 17  Temp: (!) 36.1 C (!) 36.1 C  SpO2: 96% 94%    Last Pain:  Vitals:   11/23/20 1019  TempSrc: Temporal  PainSc: 0-No pain                 Precious Haws Layman Gully

## 2020-11-23 NOTE — Op Note (Signed)
11/23/2020  9:36 AM  Patient:   Randall Cole  Pre-Op Diagnosis:   Right carpal tunnel syndrome.  Post-Op Diagnosis:   Same.  Procedure:   Endoscopic right carpal tunnel release.  Surgeon:   Pascal Lux, MD  Assistant:   Ardelle Park, PA-S  Anesthesia:   General LMA  Findings:   As above.  Complications:   None  EBL:   0 cc  Fluids:   600 cc crystalloid  TT:   22 minutes at 250 mmHg  Drains:   None  Closure:   4-0 Prolene interrupted sutures  Brief Clinical Note:   The patient is an 81 year old male with a history of progressively worsening pain and paresthesias to his right hand. His symptoms have progressed despite medications, activity modification, etc. His history and examination are consistent with carpal tunnel syndrome, confirmed by EMG. The patient presents at this time for an endoscopic right carpal tunnel release.   Procedure:   The patient was brought into the operating room and lain in the supine position. After adequate general laryngeal mask anesthesia was obtained, the right hand and upper extremity were prepped with ChloraPrep solution before being draped sterilely. Preoperative antibiotics were administered. A timeout was performed to verify the appropriate surgical site before the limb was exsanguinated with an Esmarch and the tourniquet inflated to 250 mmHg. An approximately 1.5-2 cm incision was made over the volar wrist flexion crease, centered over the palmaris longus tendon. The incision was carried down through the subcutaneous tissues with care taken to identify and protect any neurovascular structures. The distal forearm fascia was penetrated just proximal to the transverse carpal ligament. The soft tissues were released off the superficial and deep surfaces of the distal forearm fascia and this was released proximally for 3-4 cm under direct visualization.  Attention was directed distally. The Soil scientist was passed beneath the transverse  carpal ligament along the ulnar aspect of the carpal tunnel and used to release any adhesions as well as to remove any adherent synovial tissue before first the smaller then the larger of the two dilators were passed beneath the transverse carpal ligament along the ulnar margin of the carpal tunnel. The slotted cannula was introduced and the endoscope was placed into the slotted cannula and the undersurface of the transverse carpal ligament visualized. The distal margin of the transverse carpal ligament was marked by placing a 25-gauge needle percutaneously at [place] cardinal point so that it entered the distal portion of the slotted cannula. Under endoscopic visualization, the transverse carpal ligament was released from proximal to distal using the end-cutting blade. A second pass was performed to ensure complete release of the ligament. The adequacy of release was verified both endoscopically and by palpation using the freer elevator.  The wound was irrigated thoroughly with sterile saline solution before being closed using 4-0 Prolene interrupted sutures. A total of 10 cc of 0.5% plain Sensorcaine was injected in and around the incision before a sterile bulky dressing was applied to the wound. The patient was placed into a volar wrist splint before being awakened, extubated, and returned to the recovery room in satisfactory condition after tolerating the procedure well.

## 2020-12-13 DIAGNOSIS — H353221 Exudative age-related macular degeneration, left eye, with active choroidal neovascularization: Secondary | ICD-10-CM | POA: Diagnosis not present

## 2020-12-14 ENCOUNTER — Other Ambulatory Visit: Payer: Self-pay

## 2020-12-14 ENCOUNTER — Encounter: Payer: Self-pay | Admitting: Dietician

## 2020-12-14 ENCOUNTER — Encounter: Payer: PPO | Attending: Internal Medicine | Admitting: Dietician

## 2020-12-14 VITALS — Ht 68.0 in | Wt 156.3 lb

## 2020-12-14 DIAGNOSIS — R634 Abnormal weight loss: Secondary | ICD-10-CM | POA: Insufficient documentation

## 2020-12-14 DIAGNOSIS — K8689 Other specified diseases of pancreas: Secondary | ICD-10-CM | POA: Diagnosis not present

## 2020-12-14 DIAGNOSIS — Z6823 Body mass index (BMI) 23.0-23.9, adult: Secondary | ICD-10-CM | POA: Diagnosis not present

## 2020-12-14 NOTE — Patient Instructions (Signed)
Eat small meals with low fat foods, lean protein + a veggie and/or fruit.  Eat something every 3-4 hours during the day. Add snacks to help boost calories.  Good snack options: protein bar, protein drink, low fat yogurt or cottage cheese and fruit, baked chips, saltine crackers or graham crackers with low fat cheese or very small amount of peanut butter.  Drink 1% milk, can try Fairlife or other lactose free milk for less digestive symptoms. Also eat low fat ice cream but avoid lower sugar ice cream. Plan to include a vegetable or fruit with each meal, but limit or avoid tough peels/ skins, seeds.  Chew foods thoroughly before swallowing to help with digestion.

## 2020-12-14 NOTE — Progress Notes (Signed)
Medical Nutrition Therapy: Visit start time: 0830  end time: 0950  Assessment:  Diagnosis: pancreatic insufficiency Past medical history: HTN Psychosocial issues/ stress concerns: none  Preferred learning method:  No preference indicated  Current weight: 156.3lbs Height: 5'8" BMI: 23.77 Medications, supplements: reconciled list in medical record. 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 He seeks help with alleviating symptoms and possibility of "overcoming" EPI He reports some weight loss over the past 2 years; his personal goal is 177lbs. Has stopped ice cream for the most part, as well as milk. Stopped  most alcohol (was drinking moderately-- bourbon, gin and tonic, red wine); now drinks 1-2 beers a week.  Physical activity: walking 30 minutes 4x a week + generally active with yardwork  Dietary Intake:  Usual eating pattern includes 3 meals and 0-1 snacks per day. Dining out frequency: 1-2 meals per week.  Breakfast: cereal; eggs and bacon/ sausage, grits, toast/ biscuit 3x a week Snack: usually none Lunch: tomato sandwich often with ham or [place] + chips ie Doritos; apple with pb; occ fast food burger and fries Snack: sometimes protein bar or pkg crackers with cheese Supper: pork chop potatoes and green beans; spaghetti and salad and garlic toast; used to eat salad with protein but not recently due to GI upset; soup in winter; occ fried chicken meal at [facility]; steak with potato; pizza every 2-3 weeks; fish dinner takeout Snack: used to eat ice cream/ other snack nightly Beverages: water, coffee in am with creamer sm amt, no sugar, apple juice diluted with water; occ green tea (lipton)  Nutrition Care Education: Topics  covered:  Basic nutrition: basic food groups, appropriate meal and snack schedule, general nutrition guidelines    Weight gain: eating more frequently and including small amounts of healthy fats, lean proteins, and carbs Pancreatic Insufficiency: low fat and low fiber diet; eating small meals frequently; chewing foods well; avoiding poorly digested substances such as sugar alcohols, added fibers, raw vegetables and fruits particularly those with tough peels and seeds; limiting or avoiding lactose; importance of taking Creon regularly, monitoring vitamin/ nutritional status including fat soluble vitamins  Nutritional Diagnosis:  Sharpes-1.4 Altered GI function As related to pancreatic insufficiency.  As evidenced by moderate deficiency diagnosed by low pancreatic elastase.  Intervention:  Instruction and discussion as noted above. Patient has been making dietary changes to improve symptoms and is motivated to continue. Established goals for additional change with input from patient.  No follow up needed at this time; patient will schedule later if needed.  Education Materials given:  Keys to Manage Pancreatic Insufficiency Fiber Restricted Nutrition Therapy Fat Restricted Nutrition Therapy Visit summary with goals/ instructions   Learner/ who was taught:  Patient   Level of understanding: Verbalizes/ demonstrates competency   Demonstrated degree of understanding via:   Teach back Learning barriers: None  Willingness to learn/ readiness for change: Eager, change in progress   Monitoring and Evaluation:  Dietary intake, exercise, GI symptoms, and body weight      follow up: prn

## 2020-12-15 ENCOUNTER — Encounter: Payer: Self-pay | Admitting: Dermatology

## 2020-12-15 ENCOUNTER — Ambulatory Visit: Payer: PPO | Admitting: Dermatology

## 2020-12-15 DIAGNOSIS — L578 Other skin changes due to chronic exposure to nonionizing radiation: Secondary | ICD-10-CM

## 2020-12-15 DIAGNOSIS — Z85828 Personal history of other malignant neoplasm of skin: Secondary | ICD-10-CM | POA: Diagnosis not present

## 2020-12-15 DIAGNOSIS — L603 Nail dystrophy: Secondary | ICD-10-CM

## 2020-12-15 DIAGNOSIS — Z1283 Encounter for screening for malignant neoplasm of skin: Secondary | ICD-10-CM | POA: Diagnosis not present

## 2020-12-15 DIAGNOSIS — D1800 Hemangioma unspecified site: Secondary | ICD-10-CM | POA: Diagnosis not present

## 2020-12-15 DIAGNOSIS — L820 Inflamed seborrheic keratosis: Secondary | ICD-10-CM

## 2020-12-15 DIAGNOSIS — L570 Actinic keratosis: Secondary | ICD-10-CM

## 2020-12-15 DIAGNOSIS — L814 Other melanin hyperpigmentation: Secondary | ICD-10-CM

## 2020-12-15 DIAGNOSIS — D229 Melanocytic nevi, unspecified: Secondary | ICD-10-CM

## 2020-12-15 DIAGNOSIS — L821 Other seborrheic keratosis: Secondary | ICD-10-CM | POA: Diagnosis not present

## 2020-12-15 NOTE — Patient Instructions (Addendum)
If you have any questions or concerns for your doctor, please call our main line at [phone] and press option 4 to reach your doctor's medical assistant. If no one answers, please leave a voicemail as directed and we will return your call as soon as possible. Messages left after 4 pm will be answered the following business day.   You may also send us a message via MyChart. We typically respond to MyChart messages within 1-2 business days.  For prescription refills, please ask your pharmacy to contact our office. Our fax number is [phone].  If you have an urgent issue when the clinic is closed that cannot wait until the next business day, you can page your doctor at the number below.    Please note that while we do our best to be available for urgent issues outside of office hours, we are not available 24/7.   If you have an urgent issue and are unable to reach us, you may choose to seek medical care at your doctor's office, retail clinic, urgent care center, or emergency room.  If you have a medical emergency, please immediately call 911 or go to the emergency department.  Pager Numbers  - Dr. Kowalski: [phone]  - Dr. Moye: [phone]  - Dr. Stewart: [phone]  In the event of inclement weather, please call our main line at [phone] for an update on the status of any delays or closures.  Dermatology Medication Tips: Please keep the boxes that topical medications come in in order to help keep track of the instructions about where and how to use these. Pharmacies typically print the medication instructions only on the boxes and not directly on the medication tubes.   If your medication is too expensive, please contact our office at [phone] option 4 or send us a message through MyChart.   We are unable to tell what your co-pay for medications will be in advance as this is different depending on your insurance coverage. However, we may be able to find a substitute  medication at lower cost or fill out paperwork to get insurance to cover a needed medication.   If a prior authorization is required to get your medication covered by your insurance company, please allow us 1-2 business days to complete this process.  Drug prices often vary depending on where the prescription is filled and some pharmacies may offer cheaper prices.  The website www.goodrx.com contains coupons for medications through different pharmacies. The prices here do not account for what the cost may be with help from insurance (it may be cheaper with your insurance), but the website can give you the price if you did not use any insurance.  - You can print the associated coupon and take it with your prescription to the pharmacy.  - You may also stop by our office during regular business hours and pick up a GoodRx coupon card.  - If you need your prescription sent electronically to a different pharmacy, notify our office through [facility] MyChart or by phone at [phone] option 4.  Instructions for [facility] Medications  One or more of your medications was sent to the [facility] mail order compounding pharmacy. You will receive an email from them and can purchase the medicine through that link. It will then be mailed to your home at the address you confirmed. If for any reason you do not receive an email from them, please check your spam folder. If you still do not find the email,   please let us know. [facility] phone number is [phone].   

## 2020-12-15 NOTE — Progress Notes (Signed)
Follow-Up Visit   Subjective  Randall Cole is a 80 y.o. male who presents for the following: Annual Exam (Hx SCC and AK's - patient has noticed irritated lesions on his neck and shoulders that he would like checked today).  The following portions of the chart were reviewed this encounter and updated as appropriate:   Tobacco  Allergies  Meds  Problems  Med Hx  Surg Hx  Fam Hx     Review of Systems:  No other skin or systemic complaints except as noted in HPI or Assessment and Plan.  Objective  Well appearing patient in no apparent distress; mood and affect are within normal limits.  A full examination was performed including scalp, head, eyes, ears, nose, lips, neck, chest, axillae, abdomen, back, buttocks, bilateral upper extremities, bilateral lower extremities, hands, feet, fingers, toes, fingernails, and toenails. All findings within normal limits unless otherwise noted below.  B/L toenails Toenail dystrophy   Face, scalp, ears x 16 (16) Erythematous thin papules/macules with gritty scale.   Neck and shoulder x 4 Erythematous keratotic or waxy stuck-on papule or plaque.    Assessment & Plan  Nail dystrophy B/L toenails  S/P 3 mths of Lamisil - may be trauma related, do not recommend tinea treatment at this time.  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) Face, scalp, ears x 16  Destruction of lesion - Face, scalp, ears x 16 Complexity: simple   Destruction method: cryotherapy   Informed consent: discussed and consent obtained   Timeout:  patient name, date of birth, surgical site, and procedure verified Lesion destroyed using liquid nitrogen: Yes   Region frozen until ice ball extended beyond lesion: Yes   Outcome: patient tolerated procedure well with no complications   Post-procedure details: wound care instructions given    Inflamed seborrheic keratosis Neck and shoulder x 4  Destruction of lesion - Neck and shoulder x 4 Complexity: simple   Destruction method:  cryotherapy   Informed consent: discussed and consent obtained   Timeout:  patient name, date of birth, surgical site, and procedure verified Lesion destroyed using liquid nitrogen: Yes   Region frozen until ice ball extended beyond lesion: Yes   Outcome: patient tolerated procedure well with no complications   Post-procedure details: wound care instructions given    Skin cancer screening  Lentigines - Scattered tan macules - Due to sun exposure - Benign-appering, observe - Recommend daily broad spectrum sunscreen SPF 30+ to sun-exposed areas, reapply every 2 hours as needed. - Call for any changes  Seborrheic Keratoses - Stuck-on, waxy, tan-brown papules and/or plaques  - Benign-appearing - Discussed benign etiology and prognosis. - Observe - Call for any changes  Melanocytic Nevi - Tan-brown and/or pink-flesh-colored symmetric macules and papules - Benign appearing on exam today - Observation - Call clinic for new or changing moles - Recommend daily use of broad spectrum spf 30+ sunscreen to sun-exposed areas.   Hemangiomas - Red papules - Discussed benign nature - Observe - Call for any changes  Actinic Damage - Severe, confluent actinic changes with pre-cancerous actinic keratoses  - Severe, chronic, not at goal, secondary to cumulative UV radiation exposure over time - diffuse scaly erythematous macules and papules with underlying dyspigmentation - Discussed Prescription "Field Treatment" for Severe, Chronic Confluent Actinic Changes with Pre-Cancerous Actinic Keratoses Field treatment involves treatment of an entire area of skin that has confluent Actinic Changes (Sun/ Ultraviolet light damage) and PreCancerous Actinic Keratoses by method of PhotoDynamic Therapy (PDT) and/or prescription Topical  Chemotherapy agents such as 5-fluorouracil, 5-fluorouracil/calcipotriene, and/or imiquimod.  The purpose is to decrease the number of clinically evident and subclinical  PreCancerous lesions to prevent progression to development of skin cancer by chemically destroying early precancer changes that may or may not be visible.  It has been shown to reduce the risk of developing skin cancer in the treated area. As a result of treatment, redness, scaling, crusting, and open sores may occur during treatment course. One or more than one of these methods may be used and may have to be used several times to control, suppress and eliminate the PreCancerous changes. Discussed treatment course, expected reaction, and possible side effects. - Recommend daily broad spectrum sunscreen SPF 30+ to sun-exposed areas, reapply every 2 hours as needed.  - Staying in the shade or wearing long sleeves, sun glasses (UVA+UVB protection) and wide brim hats (4-inch brim around the entire circumference of the hat) are also recommended. - Call for new or changing lesions.  - In one month start 5FU/Calcipotriene mix BID x 7 days to the forehead, scalp, and temples.   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  - Call if any new or changing lesions are noted between office visits  Skin cancer screening performed today.  Return in about 6 months (around 06/17/2021) for AK follow up .  Luther Redo, CMA, am acting as scribe for Sarina Ser, MD . Documentation: I have reviewed the above documentation for accuracy and completeness, and I agree with the above.  Sarina Ser, MD

## 2020-12-22 ENCOUNTER — Other Ambulatory Visit: Payer: Self-pay

## 2020-12-30 ENCOUNTER — Ambulatory Visit: Payer: PPO | Admitting: Dietician

## 2021-01-11 ENCOUNTER — Ambulatory Visit: Payer: PPO | Admitting: Dietician

## 2021-01-24 DIAGNOSIS — H353221 Exudative age-related macular degeneration, left eye, with active choroidal neovascularization: Secondary | ICD-10-CM | POA: Diagnosis not present

## 2021-02-17 DIAGNOSIS — H40003 Preglaucoma, unspecified, bilateral: Secondary | ICD-10-CM | POA: Diagnosis not present

## 2021-03-07 DIAGNOSIS — H353221 Exudative age-related macular degeneration, left eye, with active choroidal neovascularization: Secondary | ICD-10-CM | POA: Diagnosis not present

## 2021-03-15 ENCOUNTER — Other Ambulatory Visit: Payer: Self-pay | Admitting: Surgery

## 2021-03-22 ENCOUNTER — Other Ambulatory Visit
Admission: RE | Admit: 2021-03-22 | Discharge: 2021-03-22 | Disposition: A | Discharge status: Home / Self Care | Payer: PPO | Source: Ambulatory Visit | Attending: Surgery | Admitting: Surgery

## 2021-03-22 ENCOUNTER — Other Ambulatory Visit: Payer: Self-pay

## 2021-03-22 ENCOUNTER — Ambulatory Visit: Admit: 2021-03-22 | Payer: PPO | Admitting: Ophthalmology

## 2021-03-22 DIAGNOSIS — I10 Essential (primary) hypertension: Secondary | ICD-10-CM

## 2021-03-22 HISTORY — DX: Unspecified osteoarthritis, unspecified site: M19.90

## 2021-03-22 SURGERY — PHACOEMULSIFICATION, CATARACT, WITH IOL INSERTION
Anesthesia: Topical | Laterality: Left

## 2021-03-22 NOTE — Patient Instructions (Addendum)
Your procedure is scheduled on: 03/28/21 - Tuesday Report to the Registration Desk on the 1st floor of the [facility]. To find out your arrival time, please call [phone] between 1PM - 3PM on: 03/27/21 - Monday Report to [facility] for Labs on 03/24/21 at 8:00 am.  REMEMBER: Instructions that are not followed completely may result in serious medical risk, up to and including death; or upon the discretion of your surgeon and anesthesiologist your surgery may need to be rescheduled.  Do not eat food after midnight the night before surgery.  No gum chewing, lozengers or hard candies.  You may however, drink CLEAR liquids up to 2 hours before you are scheduled to arrive for your surgery. Do not drink anything within 2 hours of your scheduled arrival time.  Clear liquids include: - water  - apple juice without pulp - gatorade (not RED, PURPLE, OR BLUE) - black coffee or tea (Do NOT add milk or creamers to the coffee or tea) Do NOT drink anything that is not on this list.  In addition, your doctor has ordered for you to drink the provided  Ensure Pre-Surgery Clear Carbohydrate Drink  Drinking this carbohydrate drink up to two hours before surgery helps to reduce insulin resistance and improve patient outcomes. Please complete drinking 2 hours prior to scheduled arrival time.  TAKE THESE MEDICATIONS THE MORNING OF SURGERY WITH A SIP OF WATER:  - amLODipine (NORVASC) 10 MG tablet  - finasteride (PROSCAR) 5 MG tablet   One week prior to surgery: Stop Anti-inflammatories (NSAIDS) such as Advil, Aleve, Ibuprofen, Motrin, Naproxen, Naprosyn and Aspirin based products such as Excedrin, Goodys Powder, BC Powder.  Stop ANY OVER THE COUNTER supplements until after surgery.  You may take Tylenol as directed if needed for pain up until the day of surgery.  No Alcohol for 24 hours before or after surgery.  No Smoking including e-cigarettes for 24 hours prior to surgery.  No  chewable tobacco products for at least 6 hours prior to surgery.  No nicotine patches on the day of surgery.  Do not use any "recreational" drugs for at least a week prior to your surgery.  Please be advised that the combination of cocaine and anesthesia may have negative outcomes, up to and including death. If you test positive for cocaine, your surgery will be cancelled.  On the morning of surgery brush your teeth with toothpaste and water, you may rinse your mouth with mouthwash if you wish. Do not swallow any toothpaste or mouthwash.  Use CHG Soap or wipes as directed on instruction sheet.  Do not wear jewelry, make-up, hairpins, clips or nail polish.  Do not wear lotions, powders, or perfumes.   Do not shave body from the neck down 48 hours prior to surgery just in case you cut yourself which could leave a site for infection.  Also, freshly shaved skin may become irritated if using the CHG soap.  Contact lenses, hearing aids and dentures may not be worn into surgery.  Do not bring valuables to the hospital. [facility] is not responsible for any missing/lost belongings or valuables.   Notify your doctor if there is any change in your medical condition (cold, fever, infection).  Wear comfortable clothing (specific to your surgery type) to the hospital.  After surgery, you can help prevent lung complications by doing breathing exercises.  Take deep breaths and cough every 1-2 hours. Your doctor may order a device called an Chiropodist to  help you take deep breaths. When coughing or sneezing, hold a pillow firmly against your incision with both hands. This is called "splinting." Doing this helps protect your incision. It also decreases belly discomfort.  If you are being admitted to the hospital overnight, leave your suitcase in the car. After surgery it may be brought to your room.  If you are being discharged the day of surgery, you will not be allowed to drive  home. You will need a responsible adult (18 years or older) to drive you home and stay with you that night.   If you are taking public transportation, you will need to have a responsible adult (18 years or older) with you. Please confirm with your physician that it is acceptable to use public transportation.   Please call the [facility] Dept. at [phone] if you have any questions about these instructions.  Surgery Visitation Policy:  Patients undergoing a surgery or procedure may have one family member or support person with them as long as that person is not COVID-19 positive or experiencing its symptoms.  That person may remain in the waiting area during the procedure and may rotate out with other people.  Inpatient Visitation:    Visiting hours are 7 a.m. to 8 p.m. Up to two visitors ages 16+ are allowed at one time in a patient room. The visitors may rotate out with other people during the day. Visitors must check out when they leave, or other visitors will not be allowed. One designated support person may remain overnight. The visitor must pass COVID-19 screenings, use hand sanitizer when entering and exiting the patient's room and wear a mask at all times, including in the patient's room. Patients must also wear a mask when staff or their visitor are in the room. Masking is required regardless of vaccination status.

## 2021-03-24 ENCOUNTER — Other Ambulatory Visit
Admission: RE | Admit: 2021-03-24 | Discharge: 2021-03-24 | Disposition: A | Discharge status: Home / Self Care | Payer: PPO | Source: Ambulatory Visit | Attending: Surgery | Admitting: Surgery

## 2021-03-24 ENCOUNTER — Encounter: Payer: Self-pay | Admitting: Urgent Care

## 2021-03-24 ENCOUNTER — Other Ambulatory Visit: Payer: Self-pay

## 2021-03-24 DIAGNOSIS — I10 Essential (primary) hypertension: Secondary | ICD-10-CM | POA: Insufficient documentation

## 2021-03-24 DIAGNOSIS — Z01812 Encounter for preprocedural laboratory examination: Secondary | ICD-10-CM | POA: Diagnosis not present

## 2021-03-24 LAB — BASIC METABOLIC PANEL
Anion gap: 5 (ref 5–15)
BUN: 14 mg/dL (ref 8–23)
CO2: 29 mmol/L (ref 22–32)
Calcium: 9.3 mg/dL (ref 8.9–10.3)
Chloride: 106 mmol/L (ref 98–111)
Creatinine, Ser: 0.76 mg/dL (ref 0.61–1.24)
GFR, Estimated: >60 mL/min (ref >60)
Glucose, Bld: 114 mg/dL — ABNORMAL HIGH (ref 70–99)
Potassium: 4.1 mmol/L (ref 3.5–5.1)
Sodium: 140 mmol/L (ref 135–145)

## 2021-03-24 LAB — CBC
HCT: 45.9 % (ref 39.0–52.0)
Hemoglobin: 15.6 g/dL (ref 13.0–17.0)
MCH: 32 pg (ref 26.0–34.0)
MCHC: 34 g/dL (ref 30.0–36.0)
MCV: 94.1 fL (ref 80.0–100.0)
Platelets: 214 10*3/uL (ref 150–400)
RBC: 4.88 MIL/uL (ref 4.22–5.81)
RDW: 13 % (ref 11.5–15.5)
WBC: 3.4 10*3/uL — ABNORMAL LOW (ref 4.0–10.5)
nRBC: 0 % (ref 0.0–0.2)

## 2021-03-28 ENCOUNTER — Encounter
Admission: RE | Disposition: A | Discharge status: Home / Self Care | Payer: Self-pay | Source: Home / Self Care | Attending: Surgery

## 2021-03-28 ENCOUNTER — Ambulatory Visit
Admission: RE | Admit: 2021-03-28 | Discharge: 2021-03-28 | Disposition: A | Discharge status: Home / Self Care | Payer: PPO | Attending: Surgery | Admitting: Surgery

## 2021-03-28 ENCOUNTER — Other Ambulatory Visit: Payer: Self-pay

## 2021-03-28 ENCOUNTER — Encounter: Payer: Self-pay | Admitting: Surgery

## 2021-03-28 ENCOUNTER — Ambulatory Visit: Payer: PPO | Admitting: Anesthesiology

## 2021-03-28 DIAGNOSIS — G5602 Carpal tunnel syndrome, left upper limb: Secondary | ICD-10-CM | POA: Diagnosis not present

## 2021-03-28 DIAGNOSIS — E782 Mixed hyperlipidemia: Secondary | ICD-10-CM | POA: Diagnosis not present

## 2021-03-28 HISTORY — PX: CARPAL TUNNEL RELEASE: SHX101

## 2021-03-28 SURGERY — RELEASE, CARPAL TUNNEL, ENDOSCOPIC
Anesthesia: Regional | Site: Wrist | Laterality: Left

## 2021-03-28 MED ORDER — METOCLOPRAMIDE HCL 5 MG/ML IJ SOLN: 5.0000 mg | Freq: Three times a day (TID) | INTRAMUSCULAR | Status: DC | PRN

## 2021-03-28 MED ORDER — ONDANSETRON HCL 4 MG/2ML IJ SOLN: 4.0000 mg | Freq: Once | INTRAMUSCULAR | Status: DC | PRN

## 2021-03-28 MED ORDER — CHLORHEXIDINE GLUCONATE 0.12 % MT SOLN: 15.0000 mL | Freq: Once | OROMUCOSAL | Status: AC

## 2021-03-28 MED ORDER — LIDOCAINE HCL (PF) 0.5 % IJ SOLN
INTRAMUSCULAR | Status: AC
  Filled 2021-03-28: qty 50

## 2021-03-28 MED ORDER — FAMOTIDINE 20 MG PO TABS
ORAL_TABLET | ORAL | Status: AC
  Administered 2021-03-28: 20 mg via ORAL
  Filled 2021-03-28: qty 1

## 2021-03-28 MED ORDER — LIDOCAINE HCL (PF) 0.5 % IJ SOLN: 50.0000 mL | Freq: Once | INTRAMUSCULAR | Status: DC

## 2021-03-28 MED ORDER — ONDANSETRON HCL 4 MG/2ML IJ SOLN: 4.0000 mg | Freq: Four times a day (QID) | INTRAMUSCULAR | Status: DC | PRN

## 2021-03-28 MED ORDER — LACTATED RINGERS IV SOLN: INTRAVENOUS | Status: DC

## 2021-03-28 MED ORDER — BUPIVACAINE HCL (PF) 0.5 % IJ SOLN
INTRAMUSCULAR | Status: DC | PRN
  Administered 2021-03-28: 10 mL

## 2021-03-28 MED ORDER — FAMOTIDINE 20 MG PO TABS: 20.0000 mg | ORAL_TABLET | Freq: Once | ORAL | Status: AC

## 2021-03-28 MED ORDER — DEXMEDETOMIDINE (PRECEDEX) IN NS 20 MCG/5ML (4 MCG/ML) IV SYRINGE
PREFILLED_SYRINGE | INTRAVENOUS | Status: DC | PRN
  Administered 2021-03-28 (×2): 4 ug via INTRAVENOUS

## 2021-03-28 MED ORDER — SODIUM CHLORIDE 0.9 % IV SOLN: INTRAVENOUS | Status: DC

## 2021-03-28 MED ORDER — ONDANSETRON HCL 4 MG PO TABS: 4.0000 mg | ORAL_TABLET | Freq: Four times a day (QID) | ORAL | Status: DC | PRN

## 2021-03-28 MED ORDER — METOCLOPRAMIDE HCL 10 MG PO TABS: 5.0000 mg | ORAL_TABLET | Freq: Three times a day (TID) | ORAL | Status: DC | PRN

## 2021-03-28 MED ORDER — CHLORHEXIDINE GLUCONATE 0.12 % MT SOLN
OROMUCOSAL | Status: AC
  Administered 2021-03-28: 15 mL via OROMUCOSAL
  Filled 2021-03-28: qty 15

## 2021-03-28 MED ORDER — ONDANSETRON HCL 4 MG/2ML IJ SOLN
INTRAMUSCULAR | Status: DC | PRN
  Administered 2021-03-28: 4 mg via INTRAVENOUS

## 2021-03-28 MED ORDER — CEFAZOLIN SODIUM-DEXTROSE 2-4 GM/100ML-% IV SOLN
INTRAVENOUS | Status: AC
  Filled 2021-03-28: qty 100

## 2021-03-28 MED ORDER — BUPIVACAINE HCL (PF) 0.5 % IJ SOLN
INTRAMUSCULAR | Status: AC
  Filled 2021-03-28: qty 30

## 2021-03-28 MED ORDER — FENTANYL CITRATE (PF) 100 MCG/2ML IJ SOLN
INTRAMUSCULAR | Status: AC
  Filled 2021-03-28: qty 2

## 2021-03-28 MED ORDER — 0.9 % SODIUM CHLORIDE (POUR BTL) OPTIME
TOPICAL | Status: DC | PRN
  Administered 2021-03-28: 250 mL

## 2021-03-28 MED ORDER — FENTANYL CITRATE (PF) 100 MCG/2ML IJ SOLN: 25.0000 ug | INTRAMUSCULAR | Status: DC | PRN

## 2021-03-28 MED ORDER — ACETAMINOPHEN 10 MG/ML IV SOLN
INTRAVENOUS | Status: DC | PRN
  Administered 2021-03-28: 1000 mg via INTRAVENOUS

## 2021-03-28 MED ORDER — CEFAZOLIN SODIUM-DEXTROSE 2-4 GM/100ML-% IV SOLN
2.0000 g | INTRAVENOUS | Status: AC
  Administered 2021-03-28: 2 g via INTRAVENOUS

## 2021-03-28 MED ORDER — FENTANYL CITRATE (PF) 100 MCG/2ML IJ SOLN
INTRAMUSCULAR | Status: DC | PRN
  Administered 2021-03-28 (×2): 50 ug via INTRAVENOUS

## 2021-03-28 MED ORDER — DEXAMETHASONE SODIUM PHOSPHATE 10 MG/ML IJ SOLN
INTRAMUSCULAR | Status: DC | PRN
  Administered 2021-03-28: 5 mg via INTRAVENOUS

## 2021-03-28 MED ORDER — ACETAMINOPHEN 10 MG/ML IV SOLN
INTRAVENOUS | Status: AC
  Filled 2021-03-28: qty 100

## 2021-03-28 MED ORDER — ORAL CARE MOUTH RINSE: 15.0000 mL | Freq: Once | OROMUCOSAL | Status: AC

## 2021-03-28 SURGICAL SUPPLY — 33 items
APL PRP STRL LF DISP 70% ISPRP (MISCELLANEOUS) ×1
BNDG COHESIVE 4X5 TAN ST LF (GAUZE/BANDAGES/DRESSINGS) ×3 IMPLANT
BNDG ELASTIC 2X5.8 VLCR STR LF (GAUZE/BANDAGES/DRESSINGS) ×3 IMPLANT
BNDG ESMARK 4X12 TAN STRL LF (GAUZE/BANDAGES/DRESSINGS) ×3 IMPLANT
CHLORAPREP W/TINT 26 (MISCELLANEOUS) ×3 IMPLANT
CORD BIP STRL DISP 12FT (MISCELLANEOUS) ×3 IMPLANT
CUFF TOURN SGL QUICK 18X4 (TOURNIQUET CUFF) ×3 IMPLANT
DRAPE SURG 17X11 SM STRL (DRAPES) ×3 IMPLANT
FORCEPS JEWEL BIP 4-3/4 STR (INSTRUMENTS) ×3 IMPLANT
GAUZE 4X4 16PLY ~~LOC~~+RFID DBL (SPONGE) ×3 IMPLANT
GAUZE SPONGE 4X4 12PLY STRL (GAUZE/BANDAGES/DRESSINGS) ×3 IMPLANT
GAUZE XEROFORM 1X8 LF (GAUZE/BANDAGES/DRESSINGS) ×3 IMPLANT
GLOVE SURG ENC MOIS LTX SZ8 (GLOVE) ×3 IMPLANT
GLOVE SURG UNDER LTX SZ8 (GLOVE) ×3 IMPLANT
GOWN STRL REUS W/ TWL LRG LVL3 (GOWN DISPOSABLE) ×1 IMPLANT
GOWN STRL REUS W/ TWL XL LVL3 (GOWN DISPOSABLE) ×1 IMPLANT
GOWN STRL REUS W/TWL LRG LVL3 (GOWN DISPOSABLE) ×3
GOWN STRL REUS W/TWL XL LVL3 (GOWN DISPOSABLE) ×3
KIT CARPAL TUNNEL (MISCELLANEOUS) ×3
KIT ESCP INSRT D SLOT CANN KN (MISCELLANEOUS) ×1 IMPLANT
KIT TURNOVER KIT A (KITS) ×3 IMPLANT
MANIFOLD NEPTUNE II (INSTRUMENTS) ×3 IMPLANT
NS IRRIG 500ML POUR BTL (IV SOLUTION) ×3 IMPLANT
PACK EXTREMITY ARMC (MISCELLANEOUS) ×3 IMPLANT
SPLINT WRIST LG LT TX990309 (SOFTGOODS) IMPLANT
SPLINT WRIST LG RT TX900304 (SOFTGOODS) IMPLANT
SPLINT WRIST M LT TX990308 (SOFTGOODS) IMPLANT
SPLINT WRIST M RT TX990303 (SOFTGOODS) IMPLANT
SPLINT WRIST XL LT TX990310 (SOFTGOODS) IMPLANT
SPLINT WRIST XL RT TX990305 (SOFTGOODS) IMPLANT
STOCKINETTE IMPERVIOUS 9X36 MD (GAUZE/BANDAGES/DRESSINGS) ×3 IMPLANT
SUT PROLENE 4 0 PS 2 18 (SUTURE) ×3 IMPLANT
WATER STERILE IRR 500ML POUR (IV SOLUTION) ×3 IMPLANT

## 2021-03-28 NOTE — Anesthesia Preprocedure Evaluation (Signed)
Anesthesia Evaluation  Patient identified by MRN, date of birth, ID band Patient awake    Reviewed: Allergy & Precautions, H&P , NPO status , Patient's Chart, lab work & pertinent test results, reviewed documented beta blocker date and time   Airway Mallampati: II   Neck ROM: full    Dental  (+) Poor Dentition, Teeth Intact   Pulmonary neg pulmonary ROS,    Pulmonary exam normal        Cardiovascular Exercise Tolerance: Good hypertension, On Medications negative cardio ROS Normal cardiovascular exam Rhythm:regular Rate:Normal     Neuro/Psych PSYCHIATRIC DISORDERS  Neuromuscular disease    GI/Hepatic negative GI ROS, Neg liver ROS,   Endo/Other  negative endocrine ROS  Renal/GU negative Renal ROS  negative genitourinary   Musculoskeletal   Abdominal   Peds  Hematology negative hematology ROS (+)   Anesthesia Other Findings Past Medical History: No date: Arthritis No date: Carpal tunnel syndrome     Comment:  bilateral 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 No date: Hypertension 12/25/2018: Squamous cell carcinoma of skin     Comment:  mid vertex scalp/EDC Past Surgical History: 11/23/2020: CARPAL TUNNEL RELEASE; Right     Comment:  Procedure: CARPAL TUNNEL RELEASE ENDOSCOPIC;  Surgeon:               Corky Mull, MD;  Location: [facility] OR[facility];  Service:               Orthopedics;  Laterality: Right; No date: COLONOSCOPY 08/12/2020: COLONOSCOPY WITH PROPOFOL; N/A     Comment:  Procedure: COLONOSCOPY WITH PROPOFOL;  Surgeon: Lucilla Lame, MD;  Location: [facility];  Service:               Endoscopy;  Laterality: N/A; 08/12/2020: ESOPHAGOGASTRODUODENOSCOPY (EGD) WITH PROPOFOL; N/A     Comment:  Procedure: ESOPHAGOGASTRODUODENOSCOPY (EGD) WITH               PROPOFOL;  Surgeon: Lucilla Lame, MD;  Location: [facility];  Service: Endoscopy;  Laterality: N/A; 2010:  HERNIA REPAIR BMI    Body Mass Index: 22.81 kg/m     Reproductive/Obstetrics negative OB ROS                             Anesthesia Physical Anesthesia Plan  ASA: 3  Anesthesia Plan: Bier Block and Bier Block-Lidocaine Only   Post-op Pain Management:  [facility] for Post-op pain   Induction:   PONV Risk Score and Plan: 2  Airway Management Planned:   Additional Equipment:   Intra-op Plan:   Post-operative Plan:   Informed Consent: I have reviewed the patients History and Physical, chart, labs and discussed the procedure including the risks, benefits and alternatives for the proposed anesthesia with the patient or authorized representative who has indicated his/her understanding and acceptance.     Dental Advisory Given  Plan Discussed with: CRNA  Anesthesia Plan Comments:         Anesthesia Quick Evaluation

## 2021-03-28 NOTE — H&P (Signed)
History of Present Illness:  Randall Cole is a 80 y.o. male who presents today for his surgical history and physical for upcoming left endoscopic carpal tunnel release surgery. Surgery is scheduled with Dr. Roland Rack on 03/28/2021. The patient denies any changes in his medical history since he was last evaluated. The patient reports a sharp, burning discomfort that is keeping him up at night despite wearing splints. He denies any injury or trauma affecting left hand. He is right-hand dominant. Pain score is a 0 out of 10 at today's visit. The patient denies any personal history of heart attack, stroke, asthma or COPD. The patient would like to have the surgery performed via a local anesthetic block and not go to sleep for the procedure.  Current Outpatient Medications:  amLODIPine (NORVASC) 10 MG tablet Take 1 tablet (10 mg total) by mouth once daily 90 tablet 3   ascorbic acid (VITAMIN C) 500 MG tablet Take 500 mg by mouth once daily.   B-complex with vitamin C (VITAMIN B COMPLEX-C ORAL) Take by mouth once daily Twice a week   cyanocobalamin (VITAMIN B12) 1000 MCG tablet Take 1,000 mcg by mouth once daily Twice a week   doxazosin (CARDURA) 2 MG tablet Take 1 tablet (2 mg total) by mouth nightly 90 tablet 3   finasteride (PROSCAR) 5 mg tablet Take 1 tablet (5 mg total) by mouth once daily 90 tablet 3   metoprolol tartrate (LOPRESSOR) 25 MG tablet Take 1 tablet (25 mg total) by mouth once daily 90 tablet 3   multivitamin tablet Take 1 tablet by mouth once daily   pancrelipase (CREON) 36,000-114,000-180,000 unit DR capsule Take 2 capsules by mouth 3 (three) times daily   pyridoxine, vitamin B6, (VITAMIN B-6) 25 MG tablet Take by mouth Twice a week   vit A/vit C/vit E/zinc/copper (PRESERVISION AREDS ORAL) Take by mouth Per patient   vitamin E 1000 UNIT capsule Take 1 capsule by mouth once daily   ZINC ORAL Take by mouth once daily   Allergies:   Neutral Red Other (See Comments)  Other reaction(s):  OTHER   Statins-Hmg-Coa Reductase Inhibitors Diarrhea, Nausea And Vomiting and Muscle Pain/Joint pain   Red Yeast Rice ([place] Purpureus) Nausea And Vomiting   Past Medical History:   BPH (benign prostatic hypertrophy)   History of shingles   Hyperlipidemia 10/11/2013   Hypertension   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)   Past Surgical History:   CATARACT EXTRACTION (Leading to detached retinal repair)   Endoscopic right carpal tunnel release Right 11/23/2020 (Dr.Torsten Weniger)   GI PROSTATE BIOPSY (Negative)   HERNIA REPAIR   Family History   Coronary Artery Disease Mother (s/p CABG)   Deep vein thrombosis (DVT or abnormal blood clot formation) Mother   High blood pressure (Hypertension) Father   No Known Problems Brother   No Known Problems Daughter   No Known Problems Son   Social History:   Socioeconomic History:   Marital status: Married  Tobacco Use   Smoking status: Never Smoker   Smokeless tobacco: Never Used  Substance and Sexual Activity   Alcohol use: No   Sexual activity: Yes  Social History Narrative  Retired from [employer] and from Press [employer]  Married -  2 children - daughter an Therapist, sports at [employer], son is with Community education officer  2 grandchildren  Tobacco - none  Etoh - occas  Hobbies - bass fishes, make wine from grapes   Review of Systems:  A comprehensive 14 point  ROS was performed, reviewed, and the pertinent orthopaedic findings are documented in the HPI.  Physical Exam: BP 120/82  Ht 172.7 cm (5\' 8" )  Wt 71.2 kg (157 lb)  BMI 23.87 kg/m  General/Constitutional: The patient appears to be well-nourished, well-developed, and in no acute distress. Neuro/Psych: Normal mood and affect, oriented to person, place and time. Eyes: Non-icteric. Pupils are equal, round, and reactive to light, and exhibit synchronous movement. ENT: Unremarkable. Lymphatic: No palpable adenopathy. Respiratory: Lungs clear to auscultation,  Normal chest excursion, No wheezes and Non-labored breathing Cardiovascular: Regular rate and rhythm. No murmurs. and No edema, swelling or tenderness, except as noted in detailed exam. Integumentary: No impressive skin lesions present, except as noted in detailed exam. Musculoskeletal: Unremarkable, except as noted in detailed exam.  Left hand/wrist exam: Skin inspection of the left hand and wrist is unremarkable. No swelling, erythema, ecchymosis, abrasions, or other skin abnormalities are identified. There is no tenderness to palpation over the dorsal or palmar aspects of the wrist or hand. He demonstrates full active and passive range of motion of the wrist without discomfort. He also can actively flex and extend all digits fully without any pain or triggering. He is neurovascularly intact to all digits. He has a negative positive test and a positive Tinel's over the carpal tunnel.  The patient has undergone a nerve conduction study in the past which did demonstrate severe right carpal tunnel syndrome in the past in addition to moderate left carpal tunnel syndrome. This nerve conduction study was performed several years ago.  Assessment:  Carpal tunnel syndrome, left.   Plan: 1. Treatment options were discussed today with the patient. 2. The patient is scheduled for a left endoscopic carpal tunnel release procedure with Dr. Roland Rack on 03/28/2021. 3. The risk and benefits of the procedure were discussed in detail with the patient. We will discuss with anesthesia about performing the surgery under a Bier block. 4. This document will serve as a surgical history and physical for the patient. 5. The patient will follow-up per standard postop protocol.  The procedure was discussed with the patient, as were the potential risks (including bleeding, infection, nerve and/or blood vessel injury, persistent or recurrent numbness, failure of the release, recurrence of carpal tunnel syndrome, need for  further surgery, blood clots, strokes, heart attacks and/or arhythmias, pneumonia, etc.) and benefits. The patient states his understanding and wishes to proceed.   H&P reviewed and patient re-examined. No changes.

## 2021-03-28 NOTE — Discharge Instructions (Addendum)
Orthopedic discharge instructions: Keep dressing dry and intact. Keep hand elevated above heart level. May shower after dressing removed on postop day 4 (Saturday). Cover sutures with Band-Aids after drying off, then reapply Velcro splint. Apply ice to affected area frequently. Take ibuprofen 600 mg TID with meals for 5-7 days, then as necessary. Take ES Tylenol or pain medication as prescribed when needed.  Return for follow-up in 10-14 days or as scheduled.  AMBULATORY SURGERY  DISCHARGE INSTRUCTIONS   The drugs that you were given will stay in your system until tomorrow so for the next 24 hours you should not:  Drive an automobile Make any legal decisions Drink any alcoholic beverage   You may resume regular meals tomorrow.  Today it is better to start with liquids and gradually work up to solid foods.  You may eat anything you prefer, but it is better to start with liquids, then soup and crackers, and gradually work up to solid foods.   Please notify your doctor immediately if you have any unusual bleeding, trouble breathing, redness and pain at the surgery site, drainage, fever, or pain not relieved by medication.    Additional Instructions:        Please contact your physician with any problems or Same Day Surgery at [phone], Monday through Friday 6 am to 4 pm, or [place] at [facility] number at [phone].

## 2021-03-28 NOTE — Anesthesia Procedure Notes (Signed)
Procedure Name: General with mask airway Date/Time: 03/28/2021 1:46 PM Performed by: Kelton Pillar, CRNA Pre-anesthesia Checklist: Patient identified, Emergency Drugs available, Suction available and Patient being monitored Oxygen Delivery Method: Simple face mask Induction Type: IV induction Placement Confirmation: positive ETCO2 and CO2 detector Dental Injury: Teeth and Oropharynx as per pre-operative assessment

## 2021-03-28 NOTE — Transfer of Care (Signed)
Immediate Anesthesia Transfer of Care Note  Patient: Randall Cole  Procedure(s) Performed: CARPAL TUNNEL RELEASE ENDOSCOPIC (Left: Wrist)  Patient Location: PACU  Anesthesia Type:MAC combined with regional for post-op pain  Level of Consciousness: awake, alert  and patient cooperative  Airway & Oxygen Therapy: Patient Spontanous Breathing  Post-op Assessment: Report given to RN and Post -op Vital signs reviewed and stable  Post vital signs: Reviewed and stable  Last Vitals:  Vitals Value Taken Time  BP 123/71 03/28/21 1403  Temp    Pulse 54 03/28/21 1406  Resp 13 03/28/21 1406  SpO2 97 % 03/28/21 1406  Vitals shown include unvalidated device data.  Last Pain:  Vitals:   03/28/21 1153  TempSrc: Temporal  PainSc: 0-No pain         Complications: No notable events documented.

## 2021-03-28 NOTE — Op Note (Signed)
03/28/2021  2:22 PM  Patient:   Randall Cole  Pre-Op Diagnosis:   Left carpal tunnel syndrome.  Post-Op Diagnosis:   Same.  Procedure:   Endoscopic left carpal tunnel release.  Surgeon:   Pascal Lux, MD  Anesthesia:   Bier block  Findings:   As above.  Complications:   None  EBL:   0 cc  Fluids:   200 cc crystalloid  TT:   26 minutes at 250 mmHg  Drains:   None  Closure:   4-0 Prolene interrupted sutures  Brief Clinical Note:   The patient is a 80 year old male with a history of progressive worsening pain and paresthesias to his left hand. His symptoms have progressed despite medications, activity modification, etc. His history and examination are consistent with carpal tunnel syndrome, confirmed by EMG. The patient presents at this time for an endoscopic left carpal tunnel release.   Procedure:   The patient was brought into the operating room and lain in the supine position. After adequate general laryngeal mask anesthesia was obtained, the left hand and upper extremity were prepped with ChloraPrep solution before being draped sterilely. Preoperative antibiotics were administered. A timeout was performed to verify the appropriate surgical site before the limb was exsanguinated with an Esmarch and the tourniquet inflated to 250 mmHg.   An approximately 1.5-2 cm incision was made over the volar wrist flexion crease, centered over the palmaris longus tendon. The incision was carried down through the subcutaneous tissues with care taken to identify and protect any neurovascular structures. The distal forearm fascia was penetrated just proximal to the transverse carpal ligament. The soft tissues were released off the superficial and deep surfaces of the distal forearm fascia and this was released proximally for 3-4 cm under direct visualization.  Attention was directed distally. The Soil scientist was passed beneath the transverse carpal ligament along the ulnar aspect of the  carpal tunnel and used to release any adhesions as well as to remove any adherent synovial tissue before first the smaller then the larger of the two dilators were passed beneath the transverse carpal ligament along the ulnar margin of the carpal tunnel. The slotted cannula was introduced and the endoscope was placed into the slotted cannula and the undersurface of the transverse carpal ligament visualized. The distal margin of the transverse carpal ligament was marked by placing a 25-gauge needle percutaneously at [place] cardinal point so that it entered the distal portion of the slotted cannula. Under endoscopic visualization, the transverse carpal ligament was released from proximal to distal using the end-cutting blade. A second pass was performed to ensure complete release of the ligament. The adequacy of release was verified both endoscopically and by palpation using the freer elevator.  The wound was irrigated thoroughly with sterile saline solution before being closed using 4-0 Prolene interrupted sutures. A total of 10 cc of 0.5% plain Sensorcaine was injected in and around the incision before a sterile bulky dressing was applied to the wound. The patient was placed into a volar wrist splint before being awakened, extubated, and returned to the recovery room in satisfactory condition after tolerating the procedure well.

## 2021-03-29 ENCOUNTER — Encounter: Payer: Self-pay | Admitting: Surgery

## 2021-03-30 NOTE — Anesthesia Postprocedure Evaluation (Signed)
Anesthesia Post Note  Patient: Mattie K Drees  Procedure(s) Performed: CARPAL TUNNEL RELEASE ENDOSCOPIC (Left: Wrist)  Patient location during evaluation: PACU Anesthesia Type: Bier Block Level of consciousness: awake and alert Pain management: pain level controlled Vital Signs Assessment: post-procedure vital signs reviewed and stable Respiratory status: spontaneous breathing, nonlabored ventilation, respiratory function stable and patient connected to nasal cannula oxygen Cardiovascular status: blood pressure returned to baseline and stable Postop Assessment: no apparent nausea or vomiting Anesthetic complications: no   No notable events documented.   Last Vitals:  Vitals:   03/28/21 1440 03/28/21 1505  BP: (P) 125/81 128/78  Pulse: (!) (P) 57 76  Resp: (P) 18 18  Temp: (!) (P) 36.3 C (!) 36.2 C  SpO2: (P) 97% 98%    Last Pain:  Vitals:   03/29/21 0842  TempSrc:   PainSc: 0-No pain                 Molli Barrows

## 2021-04-18 DIAGNOSIS — H353221 Exudative age-related macular degeneration, left eye, with active choroidal neovascularization: Secondary | ICD-10-CM | POA: Diagnosis not present

## 2021-05-18 DIAGNOSIS — K439 Ventral hernia without obstruction or gangrene: Secondary | ICD-10-CM | POA: Diagnosis not present

## 2021-05-30 ENCOUNTER — Other Ambulatory Visit: Payer: Self-pay

## 2021-05-30 ENCOUNTER — Ambulatory Visit: Payer: Self-pay | Admitting: General Surgery

## 2021-05-30 ENCOUNTER — Encounter
Admission: RE | Admit: 2021-05-30 | Discharge: 2021-05-30 | Disposition: A | Discharge status: Home / Self Care | Payer: PPO | Source: Ambulatory Visit | Attending: General Surgery | Admitting: General Surgery

## 2021-05-30 DIAGNOSIS — H353221 Exudative age-related macular degeneration, left eye, with active choroidal neovascularization: Secondary | ICD-10-CM | POA: Diagnosis not present

## 2021-05-30 HISTORY — DX: Other specified diseases of pancreas: K86.89

## 2021-05-30 HISTORY — DX: Personal history of other infectious and parasitic diseases: Z86.19

## 2021-05-30 HISTORY — DX: Benign prostatic hyperplasia without lower urinary tract symptoms: N40.0

## 2021-05-30 HISTORY — DX: Hyperlipidemia, unspecified: E78.5

## 2021-05-30 HISTORY — DX: Deficiency of other specified B group vitamins: E53.8

## 2021-05-30 HISTORY — DX: Atherosclerosis of aorta: I70.0

## 2021-05-30 NOTE — Patient Instructions (Addendum)
Your procedure is scheduled on: Monday, January 23 Report to the Registration Desk on the 1st floor of the [facility]. To find out your arrival time, please call [phone] between 1PM - 3PM on: Friday, January 20  REMEMBER: Instructions that are not followed completely may result in serious medical risk, up to and including death; or upon the discretion of your surgeon and anesthesiologist your surgery may need to be rescheduled.  Do not eat food after midnight the night before surgery.  No gum chewing, lozengers or hard candies.  You may however, drink CLEAR liquids up to 2 hours before you are scheduled to arrive for your surgery. Do not drink anything within 2 hours of your scheduled arrival time.  Clear liquids include: - water  - apple juice without pulp - gatorade (not RED, PURPLE, OR BLUE) - black coffee or tea (Do NOT add milk or creamers to the coffee or tea) Do NOT drink anything that is not on this list.  TAKE THESE MEDICATIONS THE MORNING OF SURGERY WITH A SIP OF WATER:  Amlodipine Finasteride (Proscar)  One week prior to surgery: Stop Anti-inflammatories (NSAIDS) such as Advil, Aleve, Ibuprofen, Motrin, Naproxen, Naprosyn and Aspirin based products such as Excedrin, Goodys Powder, BC Powder. Stop ANY OVER THE COUNTER supplements until after surgery. You may however, continue to take Tylenol if needed for pain up until the day of surgery.  No Alcohol for 24 hours before or after surgery.  No Smoking including e-cigarettes for 24 hours prior to surgery.   On the morning of surgery brush your teeth with toothpaste and water, you may rinse your mouth with mouthwash if you wish. Do not swallow any toothpaste or mouthwash.  Use CHG Soap as directed on instruction sheet.  Do not wear jewelry.  Do not wear lotions, powders, or perfumes.   Do not shave body from the neck down 48 hours prior to surgery just in case you cut yourself which could leave a site for  infection.  Also, freshly shaved skin may become irritated if using the CHG soap.  Contact lenses, hearing aids and dentures may not be worn into surgery.  Do not bring valuables to the hospital. [facility] is not responsible for any missing/lost belongings or valuables.   Notify your doctor if there is any change in your medical condition (cold, fever, infection).  Wear comfortable clothing (specific to your surgery type) to the hospital.  After surgery, you can help prevent lung complications by doing breathing exercises.  Take deep breaths and cough every 1-2 hours. Your doctor may order a device called an Incentive Spirometer to help you take deep breaths. When coughing or sneezing, hold a pillow firmly against your incision with both hands. This is called splinting. Doing this helps protect your incision. It also decreases belly discomfort.  If you are being discharged the day of surgery, you will not be allowed to drive home. You will need a responsible adult (18 years or older) to drive you home and stay with you that night.   If you are taking public transportation, you will need to have a responsible adult (18 years or older) with you. Please confirm with your physician that it is acceptable to use public transportation.   Please call the [facility] at [phone] if you have any questions about these instructions.  Surgery Visitation Policy:  Patients undergoing a surgery or procedure may have one family member or support person with them as long  as that person is not COVID-19 positive or experiencing its symptoms.  That person may remain in the waiting area during the procedure and may rotate out with other people.

## 2021-05-30 NOTE — H&P (View-Only) (Signed)
PATIENT PROFILE: Randall Cole is a 81 y.o. male who presents to the Clinic for consultation at the request of Dr. Sabra Heck for evaluation of ventral hernia.  PCP: Randall Pax, MD  HISTORY OF PRESENT ILLNESS: Randall Cole reports patient endorses feeling a ventral hernia for years ago. He endorses minimal pain. Minimal pain only with certain activities such as heavy lifting but no significant pain. There is no pain radiation. There has been no alleviating factors. Patient endorses that he can reduce the hernia easily. He does endorses that the hernia has been slowly increasing in size. He denies any episode of abdominal distention nausea or vomiting.  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  Overview  Ataxia, 5/17, brain MRI, carotid, echo normal   Hyperlipidemia, mixed 09/27/2015  Carpal tunnel syndrome, right 08/24/2014  HTN (hypertension), benign 08/19/2013  BPH (benign prostatic hypertrophy) 08/19/2013   GENERAL REVIEW OF SYSTEMS:   General ROS: negative for - chills, fatigue, fever, weight gain or weight loss Allergy and Immunology ROS: negative for - hives  Hematological and Lymphatic ROS: negative for - bleeding problems or bruising, negative for palpable nodes Endocrine ROS: negative for - heat or cold intolerance, hair changes Respiratory ROS: negative for - cough, shortness of breath or wheezing Cardiovascular ROS: no chest pain or palpitations GI ROS: negative for nausea,  vomiting, abdominal pain, diarrhea, constipation Musculoskeletal ROS: negative for - joint swelling or muscle pain Neurological ROS: negative for - confusion, syncope Dermatological ROS: negative for pruritus and rash Psychiatric: negative for anxiety, depression, difficulty sleeping and memory loss  MEDICATIONS: Current Outpatient Medications  Medication Sig Dispense Refill   amLODIPine (NORVASC) 10 MG tablet Take 1 tablet (10 mg total) by mouth once daily 90 tablet 3   ascorbic acid (VITAMIN C) 500 MG tablet Take 500 mg by mouth once daily.   B-complex with vitamin C (VITAMIN B COMPLEX-C ORAL) Take by mouth once daily Twice a week   cyanocobalamin (VITAMIN B12) 1000 MCG tablet Take 1,000 mcg by mouth once daily Twice a week   doxazosin (CARDURA) 2 MG tablet Take 1 tablet (2 mg total) by mouth nightly 90 tablet 3   finasteride (PROSCAR) 5 mg tablet Take 1 tablet (5 mg total) by mouth once daily 90 tablet 3   metoprolol tartrate (LOPRESSOR) 25 MG tablet Take 1 tablet (25 mg total) by mouth once daily 90 tablet 3   multivitamin tablet Take 1 tablet by mouth once daily   pancrelipase (CREON) 36,000-114,000-180,000 unit DR capsule Take 2 capsules by mouth 3 (three) times daily   pyridoxine, vitamin B6, (VITAMIN B-6) 25 MG tablet Take by mouth Twice a week   vit A/vit C/vit E/zinc/copper (PRESERVISION AREDS ORAL) Take by mouth Per patient   vitamin E 1000 UNIT capsule Take 1 capsule by mouth once daily   ZINC ORAL Take by mouth once daily   No current facility-administered medications for this visit.   ALLERGIES: Neutral red, Statins-hmg-coa reductase inhibitors, and Red yeast rice (monascus purpureus)  PAST MEDICAL HISTORY: Past Medical  History:  Diagnosis Date   BPH (benign prostatic hypertrophy)   History of shingles   Hyperlipidemia 10/11/2013   Hypertension   Pancreatic insufficiency  pancreatic enzyme insufficiency Dr. Allen Norris   PAST SURGICAL HISTORY: Past Surgical History:   Procedure Laterality Date   Endoscopic right carpal tunnel release Right 11/23/2020  Dr.Poggi   Endoscopic left carpal tunnel release Left 03/28/2021  Dr.Poggi   CATARACT EXTRACTION  Leading to detached retinal repair.   GI PROSTATE BIOPSY  Negative   HERNIA REPAIR    FAMILY HISTORY: Family History  Problem Relation Age of Onset   Coronary Artery Disease (Blocked arteries around heart) Mother  s/p CABG   Deep vein thrombosis (DVT or abnormal blood clot formation) Mother   High blood pressure (Hypertension) Father   No Known Problems Brother   No Known Problems Daughter   No Known Problems Son    SOCIAL HISTORY: Social History   Socioeconomic History   Marital status: Married  Tobacco Use   Smoking status: Never   Smokeless tobacco: Never  Substance and Sexual Activity   Alcohol use: No   Sexual activity: Yes  Social History Narrative  Retired from [employer] and from Press [employer]  Married -  2 children - daughter an Therapist, sports at [employer], son is with Community education officer  2 grandchildren  Tobacco - none  Etoh - occas  Hobbies - bass fishes, make wine from grapes   PHYSICAL EXAM: Vitals:  05/18/21 1123  BP: 137/69  Pulse: 66   Body mass index is 23.87 kg/m. Weight: 71.2 kg (157 lb)   GENERAL: Alert, active, oriented x3  HEENT: Pupils equal reactive to light. Extraocular movements are intact. Sclera clear. Palpebral conjunctiva normal red color.Pharynx clear.  NECK: Supple with no palpable mass and no adenopathy.  LUNGS: Sound clear with no rales rhonchi or wheezes.  HEART: Regular rhythm S1 and S2 without murmur.  ABDOMEN: Soft and depressible, nontender with no palpable mass, no hepatomegaly. Ventral hernia easily reducible  EXTREMITIES: Well-developed well-nourished symmetrical with no dependent edema.  NEUROLOGICAL: Awake alert oriented, facial expression symmetrical, moving all extremities.  REVIEW OF DATA: I have reviewed the following data  today: No visits with results within 3 Month(s) from this visit.  Latest known visit with results is:  Appointment on 10/25/2020  Component Date Value   WBC (White Blood Cell Co* 10/25/2020 3.8 (L)   RBC (Red Blood Cell Coun* 10/25/2020 4.67 (L)   Hemoglobin 10/25/2020 14.9   Hematocrit 10/25/2020 43.7   MCV (Mean Corpuscular Vo* 10/25/2020 93.6   MCH (Mean Corpuscular He* 10/25/2020 31.9 (H)   MCHC (Mean Corpuscular H* 10/25/2020 34.1   Platelet Count 10/25/2020 198   RDW-CV (Red Cell Distrib* 10/25/2020 13.2   MPV (Mean Platelet Volum* 10/25/2020 11.3   Neutrophils 10/25/2020 2.01   Lymphocytes 10/25/2020 1.12   Monocytes 10/25/2020 0.39   Eosinophils 10/25/2020 0.17   Basophils 10/25/2020 0.04   Neutrophil % 10/25/2020 53.6   Lymphocyte % 10/25/2020 29.9   Monocyte % 10/25/2020 10.4   Eosinophil % 10/25/2020 4.5   Basophil% 10/25/2020 1.1   Immature Granulocyte % 10/25/2020 0.5   Immature Granulocyte Cou* 10/25/2020 0.02   Glucose 10/25/2020 115 (H)   Sodium 10/25/2020 142   Potassium 10/25/2020 4.4   Chloride 10/25/2020 109   Carbon Dioxide (CO2) 10/25/2020 28.3   Urea Nitrogen (BUN) 10/25/2020 18   Creatinine 10/25/2020 0.8   Glomerular Filtration Ra* 10/25/2020 93   Calcium 10/25/2020 9.3   AST  10/25/2020 24   ALT 10/25/2020 24   Alk Phos (alkaline Phosp* 10/25/2020 76   Albumin 10/25/2020 4.1   Bilirubin, Total 10/25/2020 0.7   Protein, Total 10/25/2020 6.4   A/G Ratio 10/25/2020 1.8   Cholesterol, Total 10/25/2020 209 (H)   Triglyceride 10/25/2020 86   HDL (High Density Lipopr* 10/25/2020 55.5   LDL Calculated 10/25/2020 136 (H)   VLDL Cholesterol 10/25/2020 17   Cholesterol/HDL Ratio 10/25/2020 3.8   Magnesium 10/25/2020 2.1   PSA (Prostate Specific A* 10/25/2020 2.65   Thyroid Stimulating Horm* 10/25/2020 3.647   Color 10/25/2020 Yellow   Clarity 10/25/2020 Clear   Specific Gravity 10/25/2020 1.010   pH, Urine 10/25/2020 7.0   Protein, Urinalysis  10/25/2020 Negative   Glucose, Urinalysis 10/25/2020 Negative   Ketones, Urinalysis 10/25/2020 Negative   Blood, Urinalysis 10/25/2020 Negative   Nitrite, Urinalysis 10/25/2020 Negative   Leukocyte Esterase, Urin* 10/25/2020 Negative   White Blood Cells, Urina* 10/25/2020 None Seen   Red Blood Cells, Urinaly* 10/25/2020 None Seen   Bacteria, Urinalysis 10/25/2020 None Seen   Squamous Epithelial Cell* 10/25/2020 None Seen   Vitamin B12 10/25/2020 587    ASSESSMENT: Mr. Cerasoli is a 81 y.o. male presenting for consultation for umbilical hernia.   The patient presents with a symptomatic umbilical hernia. Patient was oriented about the diagnosis of umbilical hernia and its implication. The patient was oriented about the treatment alternatives (observation vs surgical repair). Due to patient symptoms, repair is recommended. Patient oriented about the surgical procedure, the use of mesh and its risk of complications such as: infection, bleeding, injury to vasculature, injury to bowel or bladder, and chronic pain, intestinal obstruction, among others.   Due to slightly increasing in size with time I discussed with patient recommendation of hernia repair. We discussed about the laparoscopic and open approaches. Upon question of decreased risk of infection patient most likely would like to proceed with minimally invasive hernia repair sees there is decreased risk of infection from laparoscopic wounds.   Ventral hernia without obstruction or gangrene [K43.9]  PLAN: 1. Robotic assisted laparoscopic ventral hernia repair   Patient verbalized understanding, all questions were answered, and were agreeable with the plan outlined above.   Herbert Pun, MD  Electronically signed by Herbert Pun, MD

## 2021-05-30 NOTE — H&P (Signed)
PATIENT PROFILE: Randall Cole is a 81 y.o. male who presents to the Clinic for consultation at the request of Dr. Sabra Cole for evaluation of ventral hernia.  PCP: Randall Pax, MD  HISTORY OF PRESENT ILLNESS: Randall Cole reports patient endorses feeling a ventral hernia for years ago. He endorses minimal pain. Minimal pain only with certain activities such as heavy lifting but no significant pain. There is no pain radiation. There has been no alleviating factors. Patient endorses that he can reduce the hernia easily. He does endorses that the hernia has been slowly increasing in size. He denies any episode of abdominal distention nausea or vomiting.  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  Overview  Ataxia, 5/17, brain MRI, carotid, echo normal   Hyperlipidemia, mixed 09/27/2015  Carpal tunnel syndrome, right 08/24/2014  HTN (hypertension), benign 08/19/2013  BPH (benign prostatic hypertrophy) 08/19/2013   GENERAL REVIEW OF SYSTEMS:   General ROS: negative for - chills, fatigue, fever, weight gain or weight loss Allergy and Immunology ROS: negative for - hives  Hematological and Lymphatic ROS: negative for - bleeding problems or bruising, negative for palpable nodes Endocrine ROS: negative for - heat or cold intolerance, hair changes Respiratory ROS: negative for - cough, shortness of breath or wheezing Cardiovascular ROS: no chest pain or palpitations GI ROS: negative for nausea,  vomiting, abdominal pain, diarrhea, constipation Musculoskeletal ROS: negative for - joint swelling or muscle pain Neurological ROS: negative for - confusion, syncope Dermatological ROS: negative for pruritus and rash Psychiatric: negative for anxiety, depression, difficulty sleeping and memory loss  MEDICATIONS: Current Outpatient Medications  Medication Sig Dispense Refill   amLODIPine (NORVASC) 10 MG tablet Take 1 tablet (10 mg total) by mouth once daily 90 tablet 3   ascorbic acid (VITAMIN C) 500 MG tablet Take 500 mg by mouth once daily.   B-complex with vitamin C (VITAMIN B COMPLEX-C ORAL) Take by mouth once daily Twice a week   cyanocobalamin (VITAMIN B12) 1000 MCG tablet Take 1,000 mcg by mouth once daily Twice a week   doxazosin (CARDURA) 2 MG tablet Take 1 tablet (2 mg total) by mouth nightly 90 tablet 3   finasteride (PROSCAR) 5 mg tablet Take 1 tablet (5 mg total) by mouth once daily 90 tablet 3   metoprolol tartrate (LOPRESSOR) 25 MG tablet Take 1 tablet (25 mg total) by mouth once daily 90 tablet 3   multivitamin tablet Take 1 tablet by mouth once daily   pancrelipase (CREON) 36,000-114,000-180,000 unit DR capsule Take 2 capsules by mouth 3 (three) times daily   pyridoxine, vitamin B6, (VITAMIN B-6) 25 MG tablet Take by mouth Twice a week   vit A/vit C/vit E/zinc/copper (PRESERVISION AREDS ORAL) Take by mouth Per patient   vitamin E 1000 UNIT capsule Take 1 capsule by mouth once daily   ZINC ORAL Take by mouth once daily   No current facility-administered medications for this visit.   ALLERGIES: Neutral red, Statins-hmg-coa reductase inhibitors, and Red yeast rice (monascus purpureus)  PAST MEDICAL HISTORY: Past Medical  History:  Diagnosis Date   BPH (benign prostatic hypertrophy)   History of shingles   Hyperlipidemia 10/11/2013   Hypertension   Pancreatic insufficiency  pancreatic enzyme insufficiency Dr. Allen Cole   PAST SURGICAL HISTORY: Past Surgical History:   Procedure Laterality Date   Endoscopic right carpal tunnel release Right 11/23/2020  Dr.Poggi   Endoscopic left carpal tunnel release Left 03/28/2021  Dr.Poggi   CATARACT EXTRACTION  Leading to detached retinal repair.   GI PROSTATE BIOPSY  Negative   HERNIA REPAIR    FAMILY HISTORY: Family History  Problem Relation Age of Onset   Coronary Artery Disease (Blocked arteries around heart) Mother  s/p CABG   Deep vein thrombosis (DVT or abnormal blood clot formation) Mother   High blood pressure (Hypertension) Father   No Known Problems Brother   No Known Problems Daughter   No Known Problems Son    SOCIAL HISTORY: Social History   Socioeconomic History   Marital status: Married  Tobacco Use   Smoking status: Never   Smokeless tobacco: Never  Substance and Sexual Activity   Alcohol use: No   Sexual activity: Yes  Social History Narrative  Retired from [employer] and from Press [employer]  Married -  2 children - daughter an Therapist, sports at [employer], son is with Community education officer  2 grandchildren  Tobacco - none  Etoh - occas  Hobbies - bass fishes, make wine from grapes   PHYSICAL EXAM: Vitals:  05/18/21 1123  BP: 137/69  Pulse: 66   Body mass index is 23.87 kg/m. Weight: 71.2 kg (157 lb)   GENERAL: Alert, active, oriented x3  HEENT: Pupils equal reactive to light. Extraocular movements are intact. Sclera clear. Palpebral conjunctiva normal red color.Pharynx clear.  NECK: Supple with no palpable mass and no adenopathy.  LUNGS: Sound clear with no rales rhonchi or wheezes.  HEART: Regular rhythm S1 and S2 without murmur.  ABDOMEN: Soft and depressible, nontender with no palpable mass, no hepatomegaly. Ventral hernia easily reducible  EXTREMITIES: Well-developed well-nourished symmetrical with no dependent edema.  NEUROLOGICAL: Awake alert oriented, facial expression symmetrical, moving all extremities.  REVIEW OF DATA: I have reviewed the following data  today: No visits with results within 3 Month(s) from this visit.  Latest known visit with results is:  Appointment on 10/25/2020  Component Date Value   WBC (White Blood Cell Co* 10/25/2020 3.8 (L)   RBC (Red Blood Cell Coun* 10/25/2020 4.67 (L)   Hemoglobin 10/25/2020 14.9   Hematocrit 10/25/2020 43.7   MCV (Mean Corpuscular Vo* 10/25/2020 93.6   MCH (Mean Corpuscular He* 10/25/2020 31.9 (H)   MCHC (Mean Corpuscular H* 10/25/2020 34.1   Platelet Count 10/25/2020 198   RDW-CV (Red Cell Distrib* 10/25/2020 13.2   MPV (Mean Platelet Volum* 10/25/2020 11.3   Neutrophils 10/25/2020 2.01   Lymphocytes 10/25/2020 1.12   Monocytes 10/25/2020 0.39   Eosinophils 10/25/2020 0.17   Basophils 10/25/2020 0.04   Neutrophil % 10/25/2020 53.6   Lymphocyte % 10/25/2020 29.9   Monocyte % 10/25/2020 10.4   Eosinophil % 10/25/2020 4.5   Basophil% 10/25/2020 1.1   Immature Granulocyte % 10/25/2020 0.5   Immature Granulocyte Cou* 10/25/2020 0.02   Glucose 10/25/2020 115 (H)   Sodium 10/25/2020 142   Potassium 10/25/2020 4.4   Chloride 10/25/2020 109   Carbon Dioxide (CO2) 10/25/2020 28.3   Urea Nitrogen (BUN) 10/25/2020 18   Creatinine 10/25/2020 0.8   Glomerular Filtration Ra* 10/25/2020 93   Calcium 10/25/2020 9.3   AST  10/25/2020 24   ALT 10/25/2020 24   Alk Phos (alkaline Phosp* 10/25/2020 76   Albumin 10/25/2020 4.1   Bilirubin, Total 10/25/2020 0.7   Protein, Total 10/25/2020 6.4   A/G Ratio 10/25/2020 1.8   Cholesterol, Total 10/25/2020 209 (H)   Triglyceride 10/25/2020 86   HDL (High Density Lipopr* 10/25/2020 55.5   LDL Calculated 10/25/2020 136 (H)   VLDL Cholesterol 10/25/2020 17   Cholesterol/HDL Ratio 10/25/2020 3.8   Magnesium 10/25/2020 2.1   PSA (Prostate Specific A* 10/25/2020 2.65   Thyroid Stimulating Horm* 10/25/2020 3.647   Color 10/25/2020 Yellow   Clarity 10/25/2020 Clear   Specific Gravity 10/25/2020 1.010   pH, Urine 10/25/2020 7.0   Protein, Urinalysis  10/25/2020 Negative   Glucose, Urinalysis 10/25/2020 Negative   Ketones, Urinalysis 10/25/2020 Negative   Blood, Urinalysis 10/25/2020 Negative   Nitrite, Urinalysis 10/25/2020 Negative   Leukocyte Esterase, Urin* 10/25/2020 Negative   White Blood Cells, Urina* 10/25/2020 None Seen   Red Blood Cells, Urinaly* 10/25/2020 None Seen   Bacteria, Urinalysis 10/25/2020 None Seen   Squamous Epithelial Cell* 10/25/2020 None Seen   Vitamin B12 10/25/2020 587    ASSESSMENT: Mr. Torok is a 81 y.o. male presenting for consultation for umbilical hernia.   The patient presents with a symptomatic umbilical hernia. Patient was oriented about the diagnosis of umbilical hernia and its implication. The patient was oriented about the treatment alternatives (observation vs surgical repair). Due to patient symptoms, repair is recommended. Patient oriented about the surgical procedure, the use of mesh and its risk of complications such as: infection, bleeding, injury to vasculature, injury to bowel or bladder, and chronic pain, intestinal obstruction, among others.   Due to slightly increasing in size with time I discussed with patient recommendation of hernia repair. We discussed about the laparoscopic and open approaches. Upon question of decreased risk of infection patient most likely would like to proceed with minimally invasive hernia repair sees there is decreased risk of infection from laparoscopic wounds.   Ventral hernia without obstruction or gangrene [K43.9]  PLAN: 1. Robotic assisted laparoscopic ventral hernia repair   Patient verbalized understanding, all questions were answered, and were agreeable with the plan outlined above.   Herbert Pun, MD  Electronically signed by Herbert Pun, MD

## 2021-06-05 ENCOUNTER — Encounter
Admission: RE | Disposition: A | Discharge status: Home / Self Care | Payer: Self-pay | Source: Home / Self Care | Attending: General Surgery

## 2021-06-05 ENCOUNTER — Ambulatory Visit
Admission: RE | Admit: 2021-06-05 | Discharge: 2021-06-05 | Disposition: A | Discharge status: Home / Self Care | Payer: PPO | Attending: General Surgery | Admitting: General Surgery

## 2021-06-05 ENCOUNTER — Ambulatory Visit: Payer: PPO | Admitting: Certified Registered"

## 2021-06-05 ENCOUNTER — Encounter: Payer: Self-pay | Admitting: General Surgery

## 2021-06-05 ENCOUNTER — Other Ambulatory Visit: Payer: Self-pay

## 2021-06-05 DIAGNOSIS — I700 Atherosclerosis of aorta: Secondary | ICD-10-CM | POA: Insufficient documentation

## 2021-06-05 DIAGNOSIS — K436 Other and unspecified ventral hernia with obstruction, without gangrene: Secondary | ICD-10-CM | POA: Diagnosis not present

## 2021-06-05 DIAGNOSIS — K439 Ventral hernia without obstruction or gangrene: Secondary | ICD-10-CM | POA: Diagnosis not present

## 2021-06-05 DIAGNOSIS — N400 Enlarged prostate without lower urinary tract symptoms: Secondary | ICD-10-CM | POA: Diagnosis not present

## 2021-06-05 DIAGNOSIS — I10 Essential (primary) hypertension: Secondary | ICD-10-CM | POA: Diagnosis not present

## 2021-06-05 DIAGNOSIS — K219 Gastro-esophageal reflux disease without esophagitis: Secondary | ICD-10-CM | POA: Diagnosis not present

## 2021-06-05 DIAGNOSIS — E785 Hyperlipidemia, unspecified: Secondary | ICD-10-CM | POA: Insufficient documentation

## 2021-06-05 HISTORY — PX: XI ROBOTIC ASSISTED VENTRAL HERNIA: SHX6789

## 2021-06-05 SURGERY — REPAIR, HERNIA, VENTRAL, ROBOT-ASSISTED
Anesthesia: General | Site: Abdomen

## 2021-06-05 MED ORDER — ACETAMINOPHEN 10 MG/ML IV SOLN
INTRAVENOUS | Status: DC | PRN
  Administered 2021-06-05: 1000 mg via INTRAVENOUS

## 2021-06-05 MED ORDER — PHENYLEPHRINE HCL-NACL 20-0.9 MG/250ML-% IV SOLN
INTRAVENOUS | Status: DC | PRN
  Administered 2021-06-05: 50 ug/min via INTRAVENOUS

## 2021-06-05 MED ORDER — CHLORHEXIDINE GLUCONATE 0.12 % MT SOLN
OROMUCOSAL | Status: AC
  Administered 2021-06-05: 15 mL via OROMUCOSAL
  Filled 2021-06-05: qty 15

## 2021-06-05 MED ORDER — CEFAZOLIN SODIUM-DEXTROSE 2-4 GM/100ML-% IV SOLN
2.0000 g | INTRAVENOUS | Status: AC
  Administered 2021-06-05: 2 g via INTRAVENOUS

## 2021-06-05 MED ORDER — LIDOCAINE HCL (CARDIAC) PF 100 MG/5ML IV SOSY
PREFILLED_SYRINGE | INTRAVENOUS | Status: DC | PRN
  Administered 2021-06-05: 100 mg via INTRAVENOUS

## 2021-06-05 MED ORDER — PHENYLEPHRINE HCL-NACL 20-0.9 MG/250ML-% IV SOLN
INTRAVENOUS | Status: AC
  Filled 2021-06-05: qty 250

## 2021-06-05 MED ORDER — FAMOTIDINE 20 MG PO TABS
ORAL_TABLET | ORAL | Status: AC
  Administered 2021-06-05: 20 mg via ORAL
  Filled 2021-06-05: qty 1

## 2021-06-05 MED ORDER — SEVOFLURANE IN SOLN
RESPIRATORY_TRACT | Status: AC
  Filled 2021-06-05: qty 250

## 2021-06-05 MED ORDER — LACTATED RINGERS IV SOLN: INTRAVENOUS | Status: DC

## 2021-06-05 MED ORDER — PROPOFOL 500 MG/50ML IV EMUL
INTRAVENOUS | Status: AC
  Filled 2021-06-05: qty 50

## 2021-06-05 MED ORDER — FENTANYL CITRATE (PF) 100 MCG/2ML IJ SOLN
INTRAMUSCULAR | Status: AC
  Filled 2021-06-05: qty 2

## 2021-06-05 MED ORDER — BUPIVACAINE-EPINEPHRINE (PF) 0.25% -1:200000 IJ SOLN
INTRAMUSCULAR | Status: AC
  Filled 2021-06-05: qty 30

## 2021-06-05 MED ORDER — ROCURONIUM BROMIDE 100 MG/10ML IV SOLN
INTRAVENOUS | Status: DC | PRN
  Administered 2021-06-05: 20 mg via INTRAVENOUS
  Administered 2021-06-05: 50 mg via INTRAVENOUS

## 2021-06-05 MED ORDER — FENTANYL CITRATE (PF) 100 MCG/2ML IJ SOLN
25.0000 ug | INTRAMUSCULAR | Status: DC | PRN
  Administered 2021-06-05: 25 ug via INTRAVENOUS

## 2021-06-05 MED ORDER — ACETAMINOPHEN 10 MG/ML IV SOLN
INTRAVENOUS | Status: AC
  Filled 2021-06-05: qty 100

## 2021-06-05 MED ORDER — DEXAMETHASONE SODIUM PHOSPHATE 10 MG/ML IJ SOLN
INTRAMUSCULAR | Status: DC | PRN
  Administered 2021-06-05: 10 mg via INTRAVENOUS

## 2021-06-05 MED ORDER — EPHEDRINE 5 MG/ML INJ
INTRAVENOUS | Status: AC
  Filled 2021-06-05: qty 5

## 2021-06-05 MED ORDER — BUPIVACAINE-EPINEPHRINE (PF) 0.25% -1:200000 IJ SOLN
INTRAMUSCULAR | Status: DC | PRN
  Administered 2021-06-05: 30 mL

## 2021-06-05 MED ORDER — ONDANSETRON HCL 4 MG/2ML IJ SOLN
INTRAMUSCULAR | Status: AC
  Filled 2021-06-05: qty 2

## 2021-06-05 MED ORDER — CEFAZOLIN SODIUM-DEXTROSE 2-4 GM/100ML-% IV SOLN
INTRAVENOUS | Status: AC
  Filled 2021-06-05: qty 100

## 2021-06-05 MED ORDER — SUGAMMADEX SODIUM 200 MG/2ML IV SOLN
INTRAVENOUS | Status: DC | PRN
  Administered 2021-06-05: 200 mg via INTRAVENOUS

## 2021-06-05 MED ORDER — ORAL CARE MOUTH RINSE: 15.0000 mL | Freq: Once | OROMUCOSAL | Status: AC

## 2021-06-05 MED ORDER — GLYCOPYRROLATE 0.2 MG/ML IJ SOLN
INTRAMUSCULAR | Status: AC
  Filled 2021-06-05: qty 1

## 2021-06-05 MED ORDER — FAMOTIDINE 20 MG PO TABS: 20.0000 mg | ORAL_TABLET | Freq: Once | ORAL | Status: AC

## 2021-06-05 MED ORDER — CHLORHEXIDINE GLUCONATE 0.12 % MT SOLN: 15.0000 mL | Freq: Once | OROMUCOSAL | Status: AC

## 2021-06-05 MED ORDER — OXYCODONE HCL 5 MG/5ML PO SOLN: 5.0000 mg | Freq: Once | ORAL | Status: AC | PRN

## 2021-06-05 MED ORDER — DEXMEDETOMIDINE HCL IN NACL 200 MCG/50ML IV SOLN
INTRAVENOUS | Status: AC
  Filled 2021-06-05: qty 50

## 2021-06-05 MED ORDER — ONDANSETRON HCL 4 MG/2ML IJ SOLN
INTRAMUSCULAR | Status: DC | PRN
Start: 2021-06-05 — End: 2021-06-05
  Administered 2021-06-05: 4 mg via INTRAVENOUS

## 2021-06-05 MED ORDER — OXYCODONE HCL 5 MG PO TABS: 5.0000 mg | ORAL_TABLET | Freq: Once | ORAL | Status: AC | PRN

## 2021-06-05 MED ORDER — PROPOFOL 10 MG/ML IV BOLUS
INTRAVENOUS | Status: DC | PRN
  Administered 2021-06-05: 150 mg via INTRAVENOUS

## 2021-06-05 MED ORDER — LACTATED RINGERS IV SOLN: INTRAVENOUS | Status: DC | PRN

## 2021-06-05 MED ORDER — EPHEDRINE SULFATE (PRESSORS) 50 MG/ML IJ SOLN
INTRAMUSCULAR | Status: DC | PRN
  Administered 2021-06-05: 10 mg via INTRAVENOUS

## 2021-06-05 MED ORDER — OXYCODONE HCL 5 MG PO TABS
ORAL_TABLET | ORAL | Status: AC
  Administered 2021-06-05: 5 mg via ORAL
  Filled 2021-06-05: qty 1

## 2021-06-05 MED ORDER — HYDROCODONE-ACETAMINOPHEN 5-325 MG PO TABS: 1.0000 | ORAL_TABLET | ORAL | 0 refills | Status: AC | PRN

## 2021-06-05 MED ORDER — 0.9 % SODIUM CHLORIDE (POUR BTL) OPTIME
TOPICAL | Status: DC | PRN
  Administered 2021-06-05: 500 mL

## 2021-06-05 MED ORDER — LIDOCAINE HCL (PF) 2 % IJ SOLN
INTRAMUSCULAR | Status: AC
  Filled 2021-06-05: qty 5

## 2021-06-05 MED ORDER — ROCURONIUM BROMIDE 10 MG/ML (PF) SYRINGE
PREFILLED_SYRINGE | INTRAVENOUS | Status: AC
  Filled 2021-06-05: qty 10

## 2021-06-05 MED ORDER — DEXMEDETOMIDINE HCL IN NACL 200 MCG/50ML IV SOLN
INTRAVENOUS | Status: DC | PRN
Start: 2021-06-05 — End: 2021-06-05
  Administered 2021-06-05: 12 ug via INTRAVENOUS

## 2021-06-05 SURGICAL SUPPLY — 47 items
BAG INFUSER PRESSURE 100CC (MISCELLANEOUS) IMPLANT
BLADE CLIPPER SURG (BLADE) ×2 IMPLANT
CLIPPER SURGICAL HAIR RECHARGE (MISCELLANEOUS)
COVER TIP SHEARS 8 DVNC (MISCELLANEOUS) ×1 IMPLANT
COVER TIP SHEARS 8MM DA VINCI (MISCELLANEOUS) ×2
COVER WAND RF STERILE (DRAPES) ×3 IMPLANT
DERMABOND ADVANCED (GAUZE/BANDAGES/DRESSINGS) ×2
DERMABOND ADVANCED .7 DNX12 (GAUZE/BANDAGES/DRESSINGS) ×1 IMPLANT
DRAPE ARM DVNC X/XI (DISPOSABLE) ×3 IMPLANT
DRAPE COLUMN DVNC XI (DISPOSABLE) ×1 IMPLANT
DRAPE DA VINCI XI ARM (DISPOSABLE) ×6
DRAPE DA VINCI XI COLUMN (DISPOSABLE) ×2
ELECT REM PT RETURN 9FT ADLT (ELECTROSURGICAL) ×3
ELECTRODE REM PT RTRN 9FT ADLT (ELECTROSURGICAL) ×1 IMPLANT
GLOVE SURG ENC MOIS LTX SZ6.5 (GLOVE) ×6 IMPLANT
GLOVE SURG UNDER POLY LF SZ6.5 (GLOVE) ×6 IMPLANT
GOWN STRL REUS W/ TWL LRG LVL3 (GOWN DISPOSABLE) ×3 IMPLANT
GOWN STRL REUS W/TWL LRG LVL3 (GOWN DISPOSABLE) ×6
IRRIGATOR SUCT 8 DISP DVNC XI (IRRIGATION / IRRIGATOR) IMPLANT
IRRIGATOR SUCTION 8MM XI DISP (IRRIGATION / IRRIGATOR)
IV NS 1000ML (IV SOLUTION)
IV NS 1000ML BAXH (IV SOLUTION) IMPLANT
KIT PINK PAD W/HEAD ARE REST (MISCELLANEOUS) ×3
KIT PINK PAD W/HEAD ARM REST (MISCELLANEOUS) ×1 IMPLANT
LABEL OR SOLS (LABEL) ×3 IMPLANT
MANIFOLD NEPTUNE II (INSTRUMENTS) ×3 IMPLANT
MESH VENTRALIGHT ST 4.5IN (Mesh General) ×2 IMPLANT
NDL INSUFFLATION 14GA 120MM (NEEDLE) ×1 IMPLANT
NEEDLE HYPO 22GX1.5 SAFETY (NEEDLE) ×3 IMPLANT
NEEDLE INSUFFLATION 14GA 120MM (NEEDLE) ×3 IMPLANT
NS IRRIG 500ML POUR BTL (IV SOLUTION) ×2 IMPLANT
OBTURATOR OPTICAL STANDARD 8MM (TROCAR) ×2
OBTURATOR OPTICAL STND 8 DVNC (TROCAR) ×1
OBTURATOR OPTICALSTD 8 DVNC (TROCAR) ×1 IMPLANT
PACK LAP CHOLECYSTECTOMY (MISCELLANEOUS) ×3 IMPLANT
SEAL CANN UNIV 5-8 DVNC XI (MISCELLANEOUS) ×3 IMPLANT
SEAL XI 5MM-8MM UNIVERSAL (MISCELLANEOUS) ×6
SET TUBE SMOKE EVAC HIGH FLOW (TUBING) ×3 IMPLANT
SOLUTION ELECTROLUBE (MISCELLANEOUS) ×3 IMPLANT
SURGICAL HAIR CLIPPER RECHARGE (MISCELLANEOUS) IMPLANT
SUT MNCRL AB 4-0 PS2 18 (SUTURE) ×5 IMPLANT
SUT STRATAFIX PDS 30 CT-1 (SUTURE) ×3 IMPLANT
SUT V-LOC 90 ABS 3-0 VLT  V-20 (SUTURE) ×4
SUT V-LOC 90 ABS 3-0 VLT V-20 (SUTURE) IMPLANT
TAPE TRANSPORE STRL 2 31045 (GAUZE/BANDAGES/DRESSINGS) ×3 IMPLANT
TRAY FOLEY MTR SLVR 16FR STAT (SET/KITS/TRAYS/PACK) IMPLANT
WATER STERILE IRR 500ML POUR (IV SOLUTION) ×1 IMPLANT

## 2021-06-05 NOTE — Transfer of Care (Signed)
Immediate Anesthesia Transfer of Care Note  Patient: Randall Cole  Procedure(s) Performed: XI ROBOTIC ASSISTED VENTRAL HERNIA (Abdomen)  Patient Location: PACU  Anesthesia Type:General  Level of Consciousness: sedated  Airway & Oxygen Therapy: Patient Spontanous Breathing and Patient connected to face mask oxygen  Post-op Assessment: Report given to RN and Post -op Vital signs reviewed and stable  Post vital signs: Reviewed and stable  Last Vitals:  Vitals Value Taken Time  BP 121/59   Temp    Pulse 51   Resp 21   SpO2 99     Last Pain:  Vitals:   06/05/21 0624  TempSrc: Oral  PainSc: 0-No pain         Complications: No notable events documented.

## 2021-06-05 NOTE — Anesthesia Procedure Notes (Signed)
Procedure Name: Intubation Date/Time: 06/05/2021 7:39 AM Performed by: Beverely Low, CRNA Pre-anesthesia Checklist: Patient identified, Patient being monitored, Timeout performed, Emergency Drugs available and Suction available Patient Re-evaluated:Patient Re-evaluated prior to induction Oxygen Delivery Method: Circle system utilized Preoxygenation: Pre-oxygenation with 100% oxygen Induction Type: IV induction Ventilation: Mask ventilation without difficulty Laryngoscope Size: Mac, McGraph and 4 Grade View: Grade I Tube type: Oral Tube size: 7.0 mm Number of attempts: 1 Placement Confirmation: ETT inserted through vocal cords under direct vision, positive ETCO2 and breath sounds checked- equal and bilateral Secured at: 21 cm Tube secured with: Tape Dental Injury: Teeth and Oropharynx as per pre-operative assessment

## 2021-06-05 NOTE — Interval H&P Note (Signed)
History and Physical Interval Note:  06/05/2021 6:56 AM  Randall Cole  has presented today for surgery, with the diagnosis of K43.9 Ventral hernia w/o obstruction or gangrene.  The various methods of treatment have been discussed with the patient and family. After consideration of risks, benefits and other options for treatment, the patient has consented to  Procedure(s): XI ROBOTIC [place] (N/A) as a surgical intervention.  The patient's history has been reviewed, patient examined, no change in status, stable for surgery.  I have reviewed the patient's chart and labs.  Questions were answered to the patient's satisfaction.     Herbert Pun

## 2021-06-05 NOTE — Op Note (Signed)
Preoperative diagnosis: Incarcerated Ventral Hernia  Postoperative diagnosis: Incarcerated Ventral Hernia  Procedure: Robotic assisted laparoscopic incarcerated ventral hernia repair with mesh  Anesthesia: General  Surgeon: Dr. Windell Moment  Wound Classification: Clean  Specimen: None  Complications: None  Estimated Blood Loss: 69ml  Indications: Patient is a 81 y.o. male developed a ventral hernia. This was symptomatic and incarcerated and repair was indicated.   Findings: A 3 x 2 cm with incarcerated fat ventral hernia 2. Repair achieved with closure of the anterior fascia at midline and 11.4 cm Bard mesh 3. Adequate hemostasis         Description of procedure: The patient was brought to the operating room and general anesthesia was induced. A time-out was completed verifying correct patient, procedure, site, positioning, and implant(s) and/or special equipment prior to beginning this procedure. Antibiotics were administered prior to making the incision. SCDs placed. The anterior abdominal wall was prepped and draped in the standard sterile fashion.   Palmer's point chosen for entry.  Veress needle placed and abdomen insufflated to 15cm without any dramatic increase in pressure.  Needle removed and optiview technique used to place 55mm port at same point.  No injury noted during placement. Two additional ports, 27mm x2 along left lateral aspect placed.  Xi robot then docked into place.  Hernia contents noted and reduced with combination of blunt, sharp dissection with scissors and fenestrated forceps.  Hemostasis achieved throughout this portion.  Once all hernia contents reduced, there was noted to be a 3 x 2 cm hernia defect.    Insufflation dropped to 45mm and transfacial suture with 0 stratafix used to primarily close defect under minimal tension. Bard protected 11.4 cm mesh was placed within the abdominal cavity and secured to the abdominal wall centered over the defect  using the 0 stratafix previously used to primarily close defect.  The mesh was then circumferentially sutured into the anterior abdominal wall using 2-0 VLock x2.  Any bleeding noted during this portion was no longer actively bleeding by end of securing mesh and tightening the suture.    Robot was undocked.  Abdomen then desufflated while camera within abdomen to ensure no signs of new bleed prior to removing camera and rest of ports completely.  All skin incisions closed with runninrg 4-0 Monocryl in a subcuticular fashion.  All wounds then dressed with Dermabond.  Patient was then successfully awakened and transferred to PACU in stable condition.  At the end of the procedure sponge and instrument counts were correct.

## 2021-06-05 NOTE — Anesthesia Preprocedure Evaluation (Signed)
Anesthesia Evaluation  Patient identified by MRN, date of birth, ID band Patient awake    Reviewed: Allergy & Precautions, NPO status , Patient's Chart, lab work & pertinent test results  History of Anesthesia Complications Negative for: history of anesthetic complications  Airway Mallampati: III  TM Distance: >3 FB Neck ROM: full    Dental  (+) Chipped, Poor Dentition, Missing   Pulmonary neg pulmonary ROS, neg shortness of breath,    Pulmonary exam normal        Cardiovascular Exercise Tolerance: Good hypertension, (-) angina(-) Past MI and (-) DOE Normal cardiovascular exam     Neuro/Psych PSYCHIATRIC DISORDERS  Neuromuscular disease    GI/Hepatic negative GI ROS, Neg liver ROS, neg GERD  ,  Endo/Other  negative endocrine ROS  Renal/GU      Musculoskeletal   Abdominal   Peds  Hematology negative hematology ROS (+)   Anesthesia Other Findings Past Medical History: No date: Aortic atherosclerosis (HCC) No date: Arthritis No date: BPH (benign prostatic hyperplasia) No date: Carpal tunnel syndrome     Comment:  bilateral 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 No date: History of shingles No date: Hyperlipidemia No date: Hypertension No date: Pancreatic insufficiency 12/25/2018: Squamous cell carcinoma of skin     Comment:  mid vertex scalp/EDC No date: Vitamin B12 deficiency  Past Surgical History: 11/23/2020: CARPAL TUNNEL RELEASE; Right     Comment:  Procedure: CARPAL TUNNEL RELEASE ENDOSCOPIC;  Surgeon:               Corky Mull, MD;  Location: [facility] OR[facility];  Service:               Orthopedics;  Laterality: Right; 03/28/2021: CARPAL TUNNEL RELEASE; Left     Comment:  Procedure: CARPAL TUNNEL RELEASE ENDOSCOPIC;  Surgeon:               Corky Mull, MD;  Location: [facility] OR[facility];  Service:               Orthopedics;  Laterality: Left; No date: CATARACT EXTRACTION; Right     Comment:  leading to  detached retinal repair No date: COLONOSCOPY 08/12/2020: COLONOSCOPY WITH PROPOFOL; N/A     Comment:  Procedure: COLONOSCOPY WITH PROPOFOL;  Surgeon: Lucilla Lame, MD;  Location: [facility];  Service:               Endoscopy;  Laterality: N/A; 08/12/2020: ESOPHAGOGASTRODUODENOSCOPY (EGD) WITH PROPOFOL; N/A     Comment:  Procedure: ESOPHAGOGASTRODUODENOSCOPY (EGD) WITH               PROPOFOL;  Surgeon: Lucilla Lame, MD;  Location: [facility];  Service: Endoscopy;  Laterality: N/A; No date: GI PROSTATE BIOPSY 2010: HERNIA REPAIR  BMI    Body Mass Index: 22.81 kg/m      Reproductive/Obstetrics negative OB ROS                             Anesthesia Physical Anesthesia Plan  ASA: 2  Anesthesia Plan: General ETT   Post-op Pain Management:    Induction: Intravenous  PONV Risk Score and Plan: Ondansetron, Dexamethasone, Midazolam and Treatment may vary due to age or medical condition  Airway Management Planned: Oral ETT  Additional Equipment:  Intra-op Plan:   Post-operative Plan: Extubation in OR  Informed Consent: I have reviewed the patients History and Physical, chart, labs and discussed the procedure including the risks, benefits and alternatives for the proposed anesthesia with the patient or authorized representative who has indicated his/her understanding and acceptance.     Dental Advisory Given  Plan Discussed with: Anesthesiologist, CRNA and Surgeon  Anesthesia Plan Comments: (Patient consented for risks of anesthesia including but not limited to:  - adverse reactions to medications - damage to eyes, teeth, lips or other oral mucosa - nerve damage due to positioning  - sore throat or hoarseness - Damage to heart, brain, nerves, lungs, other parts of body or loss of life  Patient voiced understanding.)        Anesthesia Quick Evaluation

## 2021-06-05 NOTE — Anesthesia Postprocedure Evaluation (Signed)
Anesthesia Post Note  Patient: Randall Cole  Procedure(s) Performed: XI ROBOTIC ASSISTED VENTRAL HERNIA (Abdomen)  Patient location during evaluation: PACU Anesthesia Type: General Level of consciousness: awake and alert Pain management: pain level controlled Vital Signs Assessment: post-procedure vital signs reviewed and stable Respiratory status: spontaneous breathing, nonlabored ventilation, respiratory function stable and patient connected to nasal cannula oxygen Cardiovascular status: blood pressure returned to baseline and stable Postop Assessment: no apparent nausea or vomiting Anesthetic complications: no   No notable events documented.   Last Vitals:  Vitals:   06/05/21 1000 06/05/21 1018  BP:  122/62  Pulse: (!) 51 (!) 53  Resp: 15 16  Temp:  (!) 36.3 C  SpO2: 95% 95%    Last Pain:  Vitals:   06/05/21 1018  TempSrc: Temporal  PainSc: 4                  Precious Haws Arora Coakley

## 2021-06-05 NOTE — Discharge Instructions (Addendum)
  Diet: Resume home heart healthy regular diet.   Activity: No heavy lifting >20 pounds (children, pets, laundry, garbage) or strenuous activity until follow-up, but light activity and walking are encouraged. Do not drive or drink alcohol if taking narcotic pain medications.  Wound care: May shower with soapy water and pat dry (do not rub incisions), but no baths or submerging incision underwater until follow-up. (no swimming)   Medications: Resume all home medications. For mild to moderate pain: acetaminophen (Tylenol) ***or ibuprofen (if no kidney disease). Combining Tylenol with alcohol can substantially increase your risk of causing liver disease. Narcotic pain medications, if prescribed, can be used for severe pain, though may cause nausea, constipation, and drowsiness. Do not combine Tylenol and Norco within a 6 hour period as Norco contains Tylenol. If you do not need the narcotic pain medication, you do not need to fill the prescription.  Call office ([phone]) at any time if any questions, worsening pain, fevers/chills, bleeding, drainage from incision site, or other concerns.   AMBULATORY SURGERY  DISCHARGE INSTRUCTIONS   The drugs that you were given will stay in your system until tomorrow so for the next 24 hours you should not:  Drive an automobile Make any legal decisions Drink any alcoholic beverage   You may resume regular meals tomorrow.  Today it is better to start with liquids and gradually work up to solid foods.  You may eat anything you prefer, but it is better to start with liquids, then soup and crackers, and gradually work up to solid foods.   Please notify your doctor immediately if you have any unusual bleeding, trouble breathing, redness and pain at the surgery site, drainage, fever, or pain not relieved by medication.    Additional Instructions:        Please contact your physician with any problems or Same Day Surgery at [phone], Monday  through Friday 6 am to 4 pm, or [place] [place] Main number at [phone].  

## 2021-06-06 ENCOUNTER — Encounter: Payer: Self-pay | Admitting: General Surgery

## 2021-06-12 ENCOUNTER — Telehealth: Payer: Self-pay

## 2021-06-12 NOTE — Telephone Encounter (Signed)
Patient called wanting [place] to order a stool sample for him said it was mentioned last visit  I didn't see where so sent for advice

## 2021-06-12 NOTE — Telephone Encounter (Signed)
I did review notes from last OV, I did not see anything in reference to stool labs... Please advise

## 2021-06-14 NOTE — Telephone Encounter (Signed)
Pt stated that he had 2 episodes of nausea and abdominal bloating, denies diarrhea... Pt stated that he wanted to know how often does he need to be monitored to make sure the Creon is working....  Please advise

## 2021-06-16 NOTE — Telephone Encounter (Signed)
I called home number, pt was not home... called mobile number, pt did not answer and not able lmovm... Will try again later

## 2021-06-19 ENCOUNTER — Telehealth: Payer: Self-pay

## 2021-06-19 NOTE — Telephone Encounter (Signed)
Patient called and asked about a epi stool test

## 2021-06-28 DIAGNOSIS — H903 Sensorineural hearing loss, bilateral: Secondary | ICD-10-CM | POA: Diagnosis not present

## 2021-06-28 DIAGNOSIS — H9319 Tinnitus, unspecified ear: Secondary | ICD-10-CM | POA: Diagnosis not present

## 2021-06-28 DIAGNOSIS — H6123 Impacted cerumen, bilateral: Secondary | ICD-10-CM | POA: Diagnosis not present

## 2021-06-29 ENCOUNTER — Other Ambulatory Visit: Payer: Self-pay

## 2021-06-29 DIAGNOSIS — K8689 Other specified diseases of pancreas: Secondary | ICD-10-CM

## 2021-07-03 DIAGNOSIS — K8689 Other specified diseases of pancreas: Secondary | ICD-10-CM | POA: Diagnosis not present

## 2021-07-06 LAB — PANCREATIC ELASTASE, FECAL: Pancreatic Elastase, Fecal: 174 ug Elast./g — ABNORMAL LOW (ref >200)

## 2021-07-10 ENCOUNTER — Ambulatory Visit: Payer: PPO | Admitting: Dermatology

## 2021-07-10 ENCOUNTER — Other Ambulatory Visit: Payer: Self-pay

## 2021-07-10 DIAGNOSIS — L820 Inflamed seborrheic keratosis: Secondary | ICD-10-CM

## 2021-07-10 DIAGNOSIS — L578 Other skin changes due to chronic exposure to nonionizing radiation: Secondary | ICD-10-CM

## 2021-07-10 DIAGNOSIS — L570 Actinic keratosis: Secondary | ICD-10-CM

## 2021-07-10 MED ORDER — FLUOROURACIL 5 % EX CREA: TOPICAL_CREAM | Freq: Two times a day (BID) | CUTANEOUS | 1 refills | Status: DC

## 2021-07-10 NOTE — Patient Instructions (Signed)

## 2021-07-10 NOTE — Progress Notes (Signed)
Follow-Up Visit   Subjective  Randall Cole is a 81 y.o. male who presents for the following: Actinic Keratosis (6 month follow up of face, scalp and ears treated with LN2). The patient has spots, moles and lesions to be evaluated, some may be new or changing and the patient has concerns that these could be cancer.  The following portions of the chart were reviewed this encounter and updated as appropriate:   Tobacco   Allergies   Meds   Problems   Med Hx   Surg Hx   Fam Hx      Review of Systems:  No other skin or systemic complaints except as noted in HPI or Assessment and Plan.  Objective  Well appearing patient in no apparent distress; mood and affect are within normal limits.  A focused examination was performed including scalp, face, hands. Relevant physical exam findings are noted in the Assessment and Plan.  Scalp (8) Erythematous thin papules/macules with gritty scale.   Right dorsum hand (5) Erythematous stuck-on, waxy papule or plaque   Assessment & Plan  AK (actinic keratosis) (8) Scalp  Actinic Damage - Severe, confluent actinic changes with pre-cancerous actinic keratoses  - Severe, chronic, not at goal, secondary to cumulative UV radiation exposure over time - diffuse scaly erythematous macules and papules with underlying dyspigmentation - Discussed Prescription "Field Treatment" for Severe, Chronic Confluent Actinic Changes with Pre-Cancerous Actinic Keratoses Field treatment involves treatment of an entire area of skin that has confluent Actinic Changes (Sun/ Ultraviolet light damage) and PreCancerous Actinic Keratoses by method of PhotoDynamic Therapy (PDT) and/or prescription Topical Chemotherapy agents such as 5-fluorouracil, 5-fluorouracil/calcipotriene, and/or imiquimod.  The purpose is to decrease the number of clinically evident and subclinical PreCancerous lesions to prevent progression to development of skin cancer by chemically destroying early precancer  changes that may or may not be visible.  It has been shown to reduce the risk of developing skin cancer in the treated area. As a result of treatment, redness, scaling, crusting, and open sores may occur during treatment course. One or more than one of these methods may be used and may have to be used several times to control, suppress and eliminate the PreCancerous changes. Discussed treatment course, expected reaction, and possible side effects. - Recommend daily broad spectrum sunscreen SPF 30+ to sun-exposed areas, reapply every 2 hours as needed.  - Staying in the shade or wearing long sleeves, sun glasses (UVA+UVB protection) and wide brim hats (4-inch brim around the entire circumference of the hat) are also recommended. - Call for new or changing lesions.  Start Fluorouracil 5%/Calcipotriene cream bid to scalp and temples x 1 week  Destruction of lesion - Scalp Complexity: simple   Destruction method: cryotherapy   Informed consent: discussed and consent obtained   Timeout:  patient name, date of birth, surgical site, and procedure verified Lesion destroyed using liquid nitrogen: Yes   Region frozen until ice ball extended beyond lesion: Yes   Outcome: patient tolerated procedure well with no complications   Post-procedure details: wound care instructions given    fluorouracil (EFUDEX) 5 % cream - Scalp Apply topically 2 (two) times daily. For 1 week to scalp and temples  Inflamed seborrheic keratosis (5) Right dorsum hand  Destruction of lesion - Right dorsum hand Complexity: simple   Destruction method: cryotherapy   Informed consent: discussed and consent obtained   Timeout:  patient name, date of birth, surgical site, and procedure verified Lesion destroyed using liquid  nitrogen: Yes   Region frozen until ice ball extended beyond lesion: Yes   Outcome: patient tolerated procedure well with no complications   Post-procedure details: wound care instructions given      Return in about 6 months (around 01/07/2022) for AK follow up.  I, Ashok Cordia, CMA, am acting as scribe for Sarina Ser, MD . Documentation: I have reviewed the above documentation for accuracy and completeness, and I agree with the above.  Sarina Ser, MD

## 2021-07-13 ENCOUNTER — Encounter: Payer: Self-pay | Admitting: Dermatology

## 2021-07-17 DIAGNOSIS — H353221 Exudative age-related macular degeneration, left eye, with active choroidal neovascularization: Secondary | ICD-10-CM | POA: Diagnosis not present

## 2021-07-31 ENCOUNTER — Other Ambulatory Visit: Payer: Self-pay | Admitting: Gastroenterology

## 2021-07-31 DIAGNOSIS — H40003 Preglaucoma, unspecified, bilateral: Secondary | ICD-10-CM | POA: Diagnosis not present

## 2021-08-31 DIAGNOSIS — H2512 Age-related nuclear cataract, left eye: Secondary | ICD-10-CM | POA: Diagnosis not present

## 2021-09-04 DIAGNOSIS — H353221 Exudative age-related macular degeneration, left eye, with active choroidal neovascularization: Secondary | ICD-10-CM | POA: Diagnosis not present

## 2021-09-06 ENCOUNTER — Encounter: Payer: Self-pay | Admitting: Ophthalmology

## 2021-09-11 NOTE — Discharge Instructions (Signed)

## 2021-09-13 ENCOUNTER — Encounter
Admission: RE | Disposition: A | Discharge status: Home / Self Care | Payer: Self-pay | Source: Home / Self Care | Attending: Ophthalmology

## 2021-09-13 ENCOUNTER — Encounter: Payer: Self-pay | Admitting: Ophthalmology

## 2021-09-13 ENCOUNTER — Ambulatory Visit: Payer: PPO | Admitting: Anesthesiology

## 2021-09-13 ENCOUNTER — Other Ambulatory Visit: Payer: Self-pay

## 2021-09-13 ENCOUNTER — Ambulatory Visit
Admission: RE | Admit: 2021-09-13 | Discharge: 2021-09-13 | Disposition: A | Discharge status: Home / Self Care | Payer: PPO | Attending: Ophthalmology | Admitting: Ophthalmology

## 2021-09-13 DIAGNOSIS — I10 Essential (primary) hypertension: Secondary | ICD-10-CM | POA: Insufficient documentation

## 2021-09-13 DIAGNOSIS — H2512 Age-related nuclear cataract, left eye: Secondary | ICD-10-CM | POA: Diagnosis not present

## 2021-09-13 DIAGNOSIS — H25812 Combined forms of age-related cataract, left eye: Secondary | ICD-10-CM | POA: Diagnosis not present

## 2021-09-13 HISTORY — DX: Presence of external hearing-aid: Z97.4

## 2021-09-13 HISTORY — PX: CATARACT EXTRACTION W/PHACO: SHX586

## 2021-09-13 SURGERY — PHACOEMULSIFICATION, CATARACT, WITH IOL INSERTION
Anesthesia: Monitor Anesthesia Care | Site: Eye | Laterality: Left

## 2021-09-13 MED ORDER — SIGHTPATH DOSE#1 BSS IO SOLN
INTRAOCULAR | Status: DC | PRN
  Administered 2021-09-13: 1 mL via INTRAMUSCULAR

## 2021-09-13 MED ORDER — FENTANYL CITRATE (PF) 100 MCG/2ML IJ SOLN
INTRAMUSCULAR | Status: DC | PRN
  Administered 2021-09-13: 50 ug via INTRAVENOUS

## 2021-09-13 MED ORDER — ARMC OPHTHALMIC DILATING DROPS
1.0000 "application " | OPHTHALMIC | Status: DC | PRN
  Administered 2021-09-13 (×3): 1 via OPHTHALMIC

## 2021-09-13 MED ORDER — BRIMONIDINE TARTRATE-TIMOLOL 0.2-0.5 % OP SOLN
OPHTHALMIC | Status: DC | PRN
  Administered 2021-09-13: 1 [drp] via OPHTHALMIC

## 2021-09-13 MED ORDER — CEFUROXIME OPHTHALMIC INJECTION 1 MG/0.1 ML
INJECTION | OPHTHALMIC | Status: DC | PRN
  Administered 2021-09-13: 1 mg via OPHTHALMIC

## 2021-09-13 MED ORDER — MIDAZOLAM HCL 2 MG/2ML IJ SOLN
INTRAMUSCULAR | Status: DC | PRN
  Administered 2021-09-13: 1 mg via INTRAVENOUS

## 2021-09-13 MED ORDER — ACETAMINOPHEN 325 MG PO TABS: 325.0000 mg | ORAL_TABLET | Freq: Once | ORAL | Status: DC

## 2021-09-13 MED ORDER — SIGHTPATH DOSE#1 NA HYALUR & NA CHOND-NA HYALUR IO KIT
PACK | INTRAOCULAR | Status: DC | PRN
  Administered 2021-09-13: 1 via OPHTHALMIC

## 2021-09-13 MED ORDER — SIGHTPATH DOSE#1 BSS IO SOLN
INTRAOCULAR | Status: DC | PRN
  Administered 2021-09-13: 67 mL via OPHTHALMIC

## 2021-09-13 MED ORDER — LACTATED RINGERS IV SOLN: INTRAVENOUS | Status: DC

## 2021-09-13 MED ORDER — SIGHTPATH DOSE#1 BSS IO SOLN
INTRAOCULAR | Status: DC | PRN
  Administered 2021-09-13: 15 mL

## 2021-09-13 MED ORDER — ACETAMINOPHEN 160 MG/5ML PO SOLN: 325.0000 mg | Freq: Once | ORAL | Status: DC

## 2021-09-13 MED ORDER — TETRACAINE HCL 0.5 % OP SOLN
1.0000 [drp] | OPHTHALMIC | Status: DC | PRN
  Administered 2021-09-13 (×3): 1 [drp] via OPHTHALMIC

## 2021-09-13 SURGICAL SUPPLY — 11 items
CATARACT SUITE SIGHTPATH (MISCELLANEOUS) ×2 IMPLANT
FEE CATARACT SUITE SIGHTPATH (MISCELLANEOUS) ×1 IMPLANT
GLOVE SRG 8 PF TXTR STRL LF DI (GLOVE) ×1 IMPLANT
GLOVE SURG ENC TEXT LTX SZ7.5 (GLOVE) ×2 IMPLANT
GLOVE SURG UNDER POLY LF SZ8 (GLOVE) ×2
LENS IOL TECNIS EYHANCE 20.0 (Intraocular Lens) ×1 IMPLANT
NDL FILTER BLUNT 18X1 1/2 (NEEDLE) ×1 IMPLANT
NEEDLE FILTER BLUNT 18X 1/2SAF (NEEDLE) ×1
NEEDLE FILTER BLUNT 18X1 1/2 (NEEDLE) ×1 IMPLANT
SYR 3ML LL SCALE MARK (SYRINGE) ×2 IMPLANT
WATER STERILE IRR 250ML POUR (IV SOLUTION) ×2 IMPLANT

## 2021-09-13 NOTE — Anesthesia Preprocedure Evaluation (Signed)
Anesthesia Evaluation  ?Patient identified by MRN, date of birth, ID band ?Patient awake ? ? ? ?Reviewed: ?Allergy & Precautions, H&P , NPO status , Patient's Chart, lab work & pertinent test results ? ?Airway ?Mallampati: II ? ?TM Distance: >3 FB ?Neck ROM: full ? ? ? Dental ?no notable dental hx. ? ?  ?Pulmonary ? ?  ?Pulmonary exam normal ?breath sounds clear to auscultation ? ? ? ? ? ? Cardiovascular ?hypertension, Normal cardiovascular exam ?Rhythm:regular Rate:Normal ? ? ?  ?Neuro/Psych ?PSYCHIATRIC DISORDERS   ? GI/Hepatic ?  ?Endo/Other  ? ? Renal/GU ?  ? ?  ?Musculoskeletal ? ? Abdominal ?  ?Peds ? Hematology ?  ?Anesthesia Other Findings ? ? Reproductive/Obstetrics ? ?  ? ? ? ? ? ? ? ? ? ? ? ? ? ?  ?  ? ? ? ? ? ? ? ? ?Anesthesia Physical ?Anesthesia Plan ? ?ASA: 2 ? ?Anesthesia Plan: MAC  ? ?Post-op Pain Management: Minimal or no pain anticipated  ? ?Induction:  ? ?PONV Risk Score and Plan: 1 and Treatment may vary due to age or medical condition, TIVA and Midazolam ? ?Airway Management Planned:  ? ?Additional Equipment:  ? ?Intra-op Plan:  ? ?Post-operative Plan:  ? ?Informed Consent: I have reviewed the patients History and Physical, chart, labs and discussed the procedure including the risks, benefits and alternatives for the proposed anesthesia with the patient or authorized representative who has indicated his/her understanding and acceptance.  ? ? ? ?Dental Advisory Given ? ?Plan Discussed with: CRNA ? ?Anesthesia Plan Comments:   ? ? ? ? ? ? ?Anesthesia Quick Evaluation ? ?

## 2021-09-13 NOTE — Transfer of Care (Signed)
Immediate Anesthesia Transfer of Care Note ? ?Patient: Randall Cole ? ?Procedure(s) Performed: CATARACT EXTRACTION PHACO AND INTRAOCULAR LENS PLACEMENT (IOC) LEFT (Left: Eye) ? ?Patient Location: PACU ? ?Anesthesia Type: MAC ? ?Level of Consciousness: awake, alert  and patient cooperative ? ?Airway and Oxygen Therapy: Patient Spontanous Breathing and Patient connected to supplemental oxygen ? ?Post-op Assessment: Post-op Vital signs reviewed, Patient's Cardiovascular Status Stable, Respiratory Function Stable, Patent Airway and No signs of Nausea or vomiting ? ?Post-op Vital Signs: Reviewed and stable ? ?Complications: No notable events documented. ? ?

## 2021-09-13 NOTE — Anesthesia Procedure Notes (Signed)
Procedure Name: [place] ?Date/Time: 09/13/2021 9:21 AM ?Performed by: Jeannene Patella, CRNA ?Pre-anesthesia Checklist: Patient identified, Emergency Drugs available, Suction available, Timeout performed and Patient being monitored ?Patient Re-evaluated:Patient Re-evaluated prior to induction ?Oxygen Delivery Method: Nasal cannula ?Placement Confirmation: positive ETCO2 ? ? ? ? ?

## 2021-09-13 NOTE — Op Note (Signed)
?  OPERATIVE NOTE ? ?Randall Cole ?[id number] ?09/13/2021 ? ? ?PREOPERATIVE DIAGNOSIS:  Nuclear sclerotic cataract left eye. H25.12 ?  ?POSTOPERATIVE DIAGNOSIS:    Nuclear sclerotic cataract left eye.   ?  ?PROCEDURE:  Phacoemusification with posterior chamber intraocular lens placement of the left eye  ?Ultrasound time: Procedure(s) with comments: ?CATARACT EXTRACTION PHACO AND INTRAOCULAR LENS PLACEMENT (IOC) LEFT (Left) - 6.20 ?00:59.0 ? ?LENS:   ?Implant Name Type Inv. Item Serial No. Manufacturer Lot No. LRB No. Used Action  ?LENS IOL TECNIS EYHANCE 20.0 - [id number] Intraocular Lens LENS IOL TECNIS EYHANCE 20.0 [id number] [employer]  Left 1 Implanted  ?   ? ?SURGEON:  Wyonia Hough, MD ?  ?ANESTHESIA:  Topical with tetracaine drops and 2% Xylocaine jelly, augmented with 1% preservative-free intracameral lidocaine. ? ?  ?COMPLICATIONS:  None. ?  ?DESCRIPTION OF PROCEDURE:  The patient was identified in the holding room and transported to the operating room and placed in the supine position under the operating microscope.  The left eye was identified as the operative eye and it was prepped and draped in the usual sterile ophthalmic fashion. ?  ?A 1 millimeter clear-corneal paracentesis was made at the 1:30 position.  0.5 ml of preservative-free 1% lidocaine was injected into the anterior chamber. ? The anterior chamber was filled with Viscoat viscoelastic.  A 2.4 millimeter keratome was used to make a near-clear corneal incision at the 10:30 position.  .  A curvilinear capsulorrhexis was made with a cystotome and capsulorrhexis forceps.  Balanced salt solution was used to hydrodissect and hydrodelineate the nucleus. ?  ?Phacoemulsification was then used in stop and chop fashion to remove the lens nucleus and epinucleus.  The remaining cortex was then removed using the irrigation and aspiration handpiece. Provisc was then placed into the capsular bag to distend it for lens placement.  A lens was then  injected into the capsular bag.  The remaining viscoelastic was aspirated. ?  ?Wounds were hydrated with balanced salt solution.  The anterior chamber was inflated to a physiologic pressure with balanced salt solution.  No wound leaks were noted. Cefuroxime 0.1 ml of a '10mg'$ /ml solution was injected into the anterior chamber for a dose of 1 mg of intracameral antibiotic at the completion of the case. ? ?  Timolol and Brimonidine drops were applied to the eye.  The patient was taken to the recovery room in stable condition without complications of anesthesia or surgery. ? ?[name] ?09/13/2021, 9:35 AM ? ?

## 2021-09-13 NOTE — H&P (Signed)
?[facility]  ? ?Primary Care Physician:  Rusty Aus, MD ?Ophthalmologist: Dr. Leandrew Koyanagi ? ?Pre-Procedure History & Physical: ?HPI:  Randall Cole is a 81 y.o. male here for ophthalmic surgery. ?  ?Past Medical History:  ?Diagnosis Date  ? Aortic atherosclerosis ([place])   ? Arthritis   ? BPH (benign prostatic hyperplasia)   ? 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 Degenerative myopia with macular hole   ? History of shingles   ? Hyperlipidemia   ? Hypertension   ? Pancreatic insufficiency   ? 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 mid vertex scalp/EDC  ? Vitamin B12 deficiency   ? Wears hearing aid in both ears   ? ? ?Past Surgical History:  ?Procedure Laterality Date  ? CARPAL TUNNEL RELEASE Right 11/23/2020  ? Procedure: CARPAL TUNNEL RELEASE ENDOSCOPIC;  Surgeon: Corky Mull, MD;  Location: [facility];  Service: Orthopedics;  Laterality: Right;  ? CARPAL TUNNEL RELEASE Left 03/28/2021  ? Procedure: CARPAL TUNNEL RELEASE ENDOSCOPIC;  Surgeon: Corky Mull, MD;  Location: [facility];  Service: Orthopedics;  Laterality: Left;  ? CATARACT EXTRACTION Right   ? leading to detached retinal repair  ? COLONOSCOPY    ? COLONOSCOPY WITH PROPOFOL N/A 08/12/2020  ? Procedure: COLONOSCOPY WITH PROPOFOL;  Surgeon: Lucilla Lame, MD;  Location: [place];  Service: Endoscopy;  Laterality: N/A;  ? ESOPHAGOGASTRODUODENOSCOPY (EGD) WITH PROPOFOL N/A 08/12/2020  ? Procedure: ESOPHAGOGASTRODUODENOSCOPY (EGD) WITH PROPOFOL;  Surgeon: Lucilla Lame, MD;  Location: [place];  Service: Endoscopy;  Laterality: N/A;  ? GI PROSTATE BIOPSY    ? HERNIA REPAIR  2010  ? XI ROBOTIC ASSISTED VENTRAL HERNIA N/A 06/05/2021  ? Procedure: XI ROBOTIC ASSISTED VENTRAL HERNIA;  Surgeon: Herbert Pun, MD;  Location: [facility];  Service: General;  Laterality: N/A;  ? ? ?Prior to Admission medications   ?Medication Sig Start Date End Date Taking? Authorizing Provider  ?amLODipine (NORVASC) 10 MG tablet  Take 10 mg by mouth daily. 06/27/19  Yes [provider]  ?Carboxymethylcellul-Glycerin (LUBRICATING EYE DROPS OP) Place 1 drop into both eyes daily as needed (dry eyes).   Yes [provider]  ?Cyanocobalamin (B-12 PO) Take 1 tablet by mouth 3 (three) times a week.   Yes [provider]  ?doxazosin (CARDURA) 2 MG tablet Take 2 mg by mouth at bedtime. 06/27/19  Yes [provider]  ?finasteride (PROSCAR) 5 MG tablet Take 5 mg by mouth daily. 10/22/18  Yes [provider]  ?GLUCOSAMINE-CHONDROIT-MSM-C-MN PO Take by mouth daily.   Yes [provider]  ?HYDROcodone-acetaminophen (NORCO/VICODIN) 5-325 MG tablet Take 1 tablet by mouth every 6 (six) hours as needed for moderate pain.   Yes [provider]  ?ibuprofen (ADVIL) 200 MG tablet Take 600 mg by mouth every 8 (eight) hours as needed for moderate pain.   Yes [provider]  ?lipase/protease/amylase (CREON) 36000 UNITS CPEP capsule TAKE 2 CAPSULES (72,000 UNITS TOTAL) BY MOUTH 3 (THREE) TIMES DAILY WITH MEALS. MAY ALSO TAKE 1 CAPSULE (36,000 UNITS TOTAL) AS NEEDED (WITH SNACKS). 08/03/21  Yes Lucilla Lame, MD  ?meloxicam (MOBIC) 15 MG tablet Take 15 mg by mouth daily.   Yes [provider]  ?metoprolol tartrate (LOPRESSOR) 25 MG tablet Take 25 mg by mouth at bedtime. 09/03/19  Yes [provider]  ?Multiple Vitamin (MULTIVITAMIN WITH MINERALS) TABS tablet Take 1 tablet by mouth daily.   Yes [provider]  ?Multiple Vitamins-Minerals (  PRESERVISION AREDS 2) CAPS Take 1 capsule by mouth 2 (two) times daily.   Yes [provider]  ?Pyridoxine HCl (VITAMIN B-6 PO) Take 1 tablet by mouth 3 (three) times a week.   Yes [provider]  ?Turmeric (QC TUMERIC COMPLEX PO) Take 1 tablet by mouth every other day.   Yes [provider]  ?vitamin C (ASCORBIC ACID) 500 MG tablet Take 500 mg by mouth every other day.   Yes [provider]  ?vitamin E  180 MG (400 UNITS) capsule Take 400 Units by mouth daily.   Yes [provider]  ?fluorouracil (EFUDEX) 5 % cream Apply topically 2 (two) times daily. For 1 week to scalp and temples 07/10/21   Ralene Bathe, MD  ?[facility] Eye injection every 7 weeks in left eye    [provider]  ? ? ?Allergies as of 08/01/2021 - Review Complete 07/13/2021  ?Allergen Reaction Noted  ? Neutral red  10/29/2012  ? Statins Diarrhea and Nausea And Vomiting 09/24/2015  ? 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 ? ?History reviewed. No pertinent family history. ? ?Social History  ? ?Socioeconomic History  ? Marital status: Married  ?  Spouse name: Mardene Celeste  ? Number of children: 2  ? Years of education: Not on file  ? Highest education level: Not on file  ?Occupational History  ? Not on file  ?Tobacco Use  ? Smoking status: Never  ? Smokeless tobacco: Never  ?Vaping Use  ? Vaping Use: Never used  ?Substance and Sexual Activity  ? Alcohol use: Yes  ?  Alcohol/week: 2.0 standard drinks  ?  Types: 2 Cans of beer per week  ?  Comment: occassional  ? Drug use: Never  ? Sexual activity: Not on file  ?Other Topics Concern  ? Not on file  ?Social History Narrative  ? Not on file  ? ?Social Determinants of Health  ? ?Financial Resource Strain: Not on file  ?Food Insecurity: Not on file  ?Transportation Needs: Not on file  ?Physical Activity: Not on file  ?Stress: Not on file  ?Social Connections: Not on file  ?Intimate Partner Violence: Not on file  ? ? ?Review of Systems: ?See HPI, otherwise negative ROS ? ?Physical Exam: ?BP (!) 149/78   Pulse 65   Temp 98.1 ?F (36.7 ?C)   Ht '5\' 8"'$  (1.727 m)   Wt 65.8 kg   SpO2 97%   BMI 22.05 kg/m?  ?General:   Alert,  pleasant and cooperative in NAD ?Head:  Normocephalic and atraumatic. ?Lungs:  Clear to auscultation.    ?Heart:  Regular rate and rhythm.  ? ?Impression/Plan: ?Randall Cole is here for ophthalmic surgery. ? ?Risks, benefits, limitations,  and alternatives regarding ophthalmic surgery have been reviewed with the patient.  Questions have been answered.  All parties agreeable. ? ? Leandrew Koyanagi, MD  09/13/2021, 8:56 AM ? ?

## 2021-09-13 NOTE — Anesthesia Postprocedure Evaluation (Signed)
Anesthesia Post Note ? ?Patient: Randall Cole ? ?Procedure(s) Performed: CATARACT EXTRACTION PHACO AND INTRAOCULAR LENS PLACEMENT (IOC) LEFT (Left: Eye) ? ? ?  ?Patient location during evaluation: PACU ?Anesthesia Type: MAC ?Level of consciousness: awake and alert and oriented ?Pain management: satisfactory to patient ?Vital Signs Assessment: post-procedure vital signs reviewed and stable ?Respiratory status: spontaneous breathing, nonlabored ventilation and respiratory function stable ?Cardiovascular status: blood pressure returned to baseline and stable ?Postop Assessment: Adequate PO intake and No signs of nausea or vomiting ?Anesthetic complications: no ? ? ?No notable events documented. ? ?Raliegh Ip ? ? ? ? ? ?

## 2021-09-14 ENCOUNTER — Encounter: Payer: Self-pay | Admitting: Ophthalmology

## 2021-09-18 DIAGNOSIS — H2512 Age-related nuclear cataract, left eye: Secondary | ICD-10-CM | POA: Diagnosis not present

## 2021-10-13 DIAGNOSIS — H353221 Exudative age-related macular degeneration, left eye, with active choroidal neovascularization: Secondary | ICD-10-CM | POA: Diagnosis not present

## 2021-10-17 DIAGNOSIS — H353221 Exudative age-related macular degeneration, left eye, with active choroidal neovascularization: Secondary | ICD-10-CM | POA: Diagnosis not present

## 2021-10-27 DIAGNOSIS — Z125 Encounter for screening for malignant neoplasm of prostate: Secondary | ICD-10-CM | POA: Diagnosis not present

## 2021-10-27 DIAGNOSIS — E782 Mixed hyperlipidemia: Secondary | ICD-10-CM | POA: Diagnosis not present

## 2021-10-27 DIAGNOSIS — E538 Deficiency of other specified B group vitamins: Secondary | ICD-10-CM | POA: Diagnosis not present

## 2021-11-03 DIAGNOSIS — Z0000 Encounter for general adult medical examination without abnormal findings: Secondary | ICD-10-CM | POA: Diagnosis not present

## 2021-11-03 DIAGNOSIS — E538 Deficiency of other specified B group vitamins: Secondary | ICD-10-CM | POA: Diagnosis not present

## 2021-11-03 DIAGNOSIS — I700 Atherosclerosis of aorta: Secondary | ICD-10-CM | POA: Diagnosis not present

## 2021-11-03 DIAGNOSIS — Z125 Encounter for screening for malignant neoplasm of prostate: Secondary | ICD-10-CM | POA: Diagnosis not present

## 2021-11-03 DIAGNOSIS — H353220 Exudative age-related macular degeneration, left eye, stage unspecified: Secondary | ICD-10-CM | POA: Diagnosis not present

## 2021-11-03 DIAGNOSIS — E782 Mixed hyperlipidemia: Secondary | ICD-10-CM | POA: Diagnosis not present

## 2021-11-13 ENCOUNTER — Other Ambulatory Visit: Payer: Self-pay | Admitting: Gastroenterology

## 2021-11-20 ENCOUNTER — Telehealth: Payer: Self-pay | Admitting: Gastroenterology

## 2021-11-20 NOTE — Telephone Encounter (Signed)
Patient left vm requesting a call back from Dr Dorothey Baseman nurse in reference to getting some lab work done.

## 2021-11-21 DIAGNOSIS — H353221 Exudative age-related macular degeneration, left eye, with active choroidal neovascularization: Secondary | ICD-10-CM | POA: Diagnosis not present

## 2021-11-22 NOTE — Telephone Encounter (Signed)
Patient left a voicemail and would like a return call from Dr. Allen Norris nurse

## 2021-11-22 NOTE — Telephone Encounter (Signed)
Pt schedule f/u appointment

## 2021-12-12 ENCOUNTER — Encounter: Payer: Self-pay | Admitting: Gastroenterology

## 2021-12-12 ENCOUNTER — Ambulatory Visit: Payer: PPO | Admitting: Gastroenterology

## 2021-12-12 DIAGNOSIS — K8681 Exocrine pancreatic insufficiency: Secondary | ICD-10-CM | POA: Diagnosis not present

## 2021-12-12 NOTE — Progress Notes (Signed)
Primary Care Physician: Rusty Aus, MD  Primary Gastroenterologist:  Dr. Lucilla Lame  Chief Complaint  Patient presents with   Follow-up    HPI: Randall Cole is a 81 y.o. male here who has a history of exocrine pancreatic insufficiency.  The patient has been doing better on replacement therapy but still has some concerns.  He states that he is concerned because when he wipes after a bowel movement there is no stool on the tissue paper which is new for him.  There is no report of any abdominal pain and he states he has gained 2 pounds.  There is no report of any nausea vomiting fevers or chills.  He also states that he was reading something on the Internet for blood test that could tell which enzymes he is actually missing and replaced only those with over-the-counter supplements since his replacement therapy is quite expensive.  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     Carboxymethylcellul-Glycerin (LUBRICATING EYE DROPS OP) Place 1 drop into both eyes daily as needed (dry eyes).     Cyanocobalamin (B-12 PO) Take 1 tablet by mouth 3 (three) times a week.     doxazosin (CARDURA) 2 MG tablet Take 2 mg by mouth at bedtime.     finasteride (PROSCAR) 5 MG tablet Take 5 mg by mouth daily.     fluorouracil (EFUDEX) 5 % cream Apply topically 2 (two) times daily. For 1 week to scalp and temples 15 g 1   GLUCOSAMINE-CHONDROIT-MSM-C-MN PO Take by mouth daily.      HYDROcodone-acetaminophen (NORCO/VICODIN) 5-325 MG tablet Take 1 tablet by mouth every 6 (six) hours as needed for moderate pain.     ibuprofen (ADVIL) 200 MG tablet Take 600 mg by mouth every 8 (eight) hours as needed for moderate pain.     lipase/protease/amylase (CREON) 36000 UNITS CPEP capsule TAKE 2 CAPS BY MOUTH 3 TIMES DAILY WITH MEALS. MAY ALSO TAKE 1 CAPSULE AS NEEDED (WITH SNACKS). 210 capsule 1   meloxicam (MOBIC) 15 MG tablet Take 15 mg by mouth daily.     metoprolol tartrate (LOPRESSOR) 25 MG tablet Take 25 mg by mouth at bedtime.     Multiple Vitamin (MULTIVITAMIN WITH MINERALS) TABS tablet Take 1 tablet by mouth daily.     Multiple Vitamins-Minerals (PRESERVISION AREDS 2) CAPS Take 1 capsule by mouth 2 (two) times daily.     NON FORMULARY Eye injection every 7 weeks in left eye     Pyridoxine HCl (VITAMIN B-6 PO) Take 1 tablet by mouth 3 (three) times a week.     Turmeric (QC TUMERIC COMPLEX PO) Take 1 tablet by mouth every other day.     vitamin C (ASCORBIC ACID) 500 MG tablet Take 500 mg by mouth every other day.     vitamin E 180 MG (400 UNITS) capsule Take 400 Units by mouth  daily.     No current facility-administered medications for this visit.    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 ENT: Negative for hoarseness, difficulty swallowing , nasal congestion. CV: Negative for chest pain, angina, palpitations, dyspnea on exertion, peripheral edema.  Respiratory: Negative for dyspnea at rest, dyspnea on exertion, cough, sputum, wheezing.  GI: See history of present illness. GU:  Negative for dysuria, hematuria, urinary incontinence, urinary frequency, nocturnal urination.  Endo: Negative for unusual  weight change.    Physical Examination:   There were no vitals taken for this visit.  General: Well-nourished, well-developed in no acute distress.  Eyes: No icterus. Conjunctivae pink. Extremities: No lower extremity edema. No clubbing or deformities. Neuro: Alert and oriented x 3.  Grossly intact. Skin: Warm and dry, no jaundice.   Psych: Alert and cooperative, normal mood and affect.  Labs:    Imaging Studies: No results found.  Assessment and Plan:   Cadyn FERGUSON GERTNER is a 81 y.o. y/o male who comes in today for follow-up of exocrine pancreatic insufficiency.  The patient has been doing well and is wondering if there is any blood test that he can take to see which enzymes he is missing been only replaced though since his Creon is quite expensive.  The patient states he saw something on the Internet that address this.  I have asked him to find the article and forwarded to me.  Otherwise he will continue to do what he is doing at the present time since he has been responding well.  The patient has been explained the plan and agrees with it.     Lucilla Lame, MD. Marval Regal    Note: This dictation was prepared with Dragon dictation along with smaller phrase technology. Any transcriptional errors that result from this process are unintentional.

## 2021-12-19 DIAGNOSIS — H353221 Exudative age-related macular degeneration, left eye, with active choroidal neovascularization: Secondary | ICD-10-CM | POA: Diagnosis not present

## 2022-01-23 DIAGNOSIS — H353221 Exudative age-related macular degeneration, left eye, with active choroidal neovascularization: Secondary | ICD-10-CM | POA: Diagnosis not present

## 2022-02-02 ENCOUNTER — Other Ambulatory Visit: Payer: Self-pay | Admitting: Gastroenterology

## 2022-03-13 DIAGNOSIS — H353221 Exudative age-related macular degeneration, left eye, with active choroidal neovascularization: Secondary | ICD-10-CM | POA: Diagnosis not present

## 2022-04-16 ENCOUNTER — Ambulatory Visit (INDEPENDENT_AMBULATORY_CARE_PROVIDER_SITE_OTHER): Payer: PPO | Admitting: Dermatology

## 2022-04-16 ENCOUNTER — Encounter: Payer: Self-pay | Admitting: Dermatology

## 2022-04-16 VITALS — BP 135/84 | HR 82

## 2022-04-16 DIAGNOSIS — L814 Other melanin hyperpigmentation: Secondary | ICD-10-CM | POA: Diagnosis not present

## 2022-04-16 DIAGNOSIS — L578 Other skin changes due to chronic exposure to nonionizing radiation: Secondary | ICD-10-CM | POA: Diagnosis not present

## 2022-04-16 DIAGNOSIS — L570 Actinic keratosis: Secondary | ICD-10-CM | POA: Diagnosis not present

## 2022-04-16 DIAGNOSIS — L821 Other seborrheic keratosis: Secondary | ICD-10-CM | POA: Diagnosis not present

## 2022-04-16 DIAGNOSIS — D229 Melanocytic nevi, unspecified: Secondary | ICD-10-CM

## 2022-04-16 DIAGNOSIS — L603 Nail dystrophy: Secondary | ICD-10-CM

## 2022-04-16 DIAGNOSIS — L820 Inflamed seborrheic keratosis: Secondary | ICD-10-CM | POA: Diagnosis not present

## 2022-04-16 DIAGNOSIS — Z85828 Personal history of other malignant neoplasm of skin: Secondary | ICD-10-CM | POA: Diagnosis not present

## 2022-04-16 DIAGNOSIS — H353221 Exudative age-related macular degeneration, left eye, with active choroidal neovascularization: Secondary | ICD-10-CM | POA: Diagnosis not present

## 2022-04-16 DIAGNOSIS — Z1283 Encounter for screening for malignant neoplasm of skin: Secondary | ICD-10-CM | POA: Diagnosis not present

## 2022-04-16 DIAGNOSIS — Z8589 Personal history of malignant neoplasm of other organs and systems: Secondary | ICD-10-CM

## 2022-04-16 NOTE — Patient Instructions (Signed)
Cryotherapy Aftercare  Wash gently with soap and water everyday.   Apply Vaseline and Band-Aid daily until healed.     Recommend daily broad spectrum sunscreen SPF 30+ to sun-exposed areas, reapply every 2 hours as needed. Call for new or changing lesions.  Staying in the shade or wearing long sleeves, sun glasses (UVA+UVB protection) and wide brim hats (4-inch brim around the entire circumference of the hat) are also recommended for sun protection.     Melanoma ABCDEs  Melanoma is the most dangerous type of skin cancer, and is the leading cause of death from skin disease.  You are more likely to develop melanoma if you: Have light-colored skin, light-colored eyes, or red or blond hair Spend a lot of time in the sun Tan regularly, either outdoors or in a tanning bed Have had blistering sunburns, especially during childhood Have a close family member who has had a melanoma Have atypical moles or large birthmarks  Early detection of melanoma is key since treatment is typically straightforward and cure rates are extremely high if we catch it early.   The first sign of melanoma is often a change in a mole or a new dark spot.  The ABCDE system is a way of remembering the signs of melanoma.  A for asymmetry:  The two halves do not match. B for border:  The edges of the growth are irregular. C for color:  A mixture of colors are present instead of an even brown color. D for diameter:  Melanomas are usually (but not always) greater than 6mm - the size of a pencil eraser. E for evolution:  The spot keeps changing in size, shape, and color.  Please check your skin once per month between visits. You can use a small mirror in front and a large mirror behind you to keep an eye on the back side or your body.   If you see any new or changing lesions before your next follow-up, please call to schedule a visit.  Please continue daily skin protection including broad spectrum sunscreen SPF 30+ to  sun-exposed areas, reapplying every 2 hours as needed when you're outdoors.   Staying in the shade or wearing long sleeves, sun glasses (UVA+UVB protection) and wide brim hats (4-inch brim around the entire circumference of the hat) are also recommended for sun protection.     Due to recent changes in healthcare laws, you may see results of your pathology and/or laboratory studies on MyChart before the doctors have had a chance to review them. We understand that in some cases there may be results that are confusing or concerning to you. Please understand that not all results are received at the same time and often the doctors may need to interpret multiple results in order to provide you with the best plan of care or course of treatment. Therefore, we ask that you please give us 2 business days to thoroughly review all your results before contacting the office for clarification. Should we see a critical lab result, you will be contacted sooner.   If You Need Anything After Your Visit  If you have any questions or concerns for your doctor, please call our main line at [phone] and press option 4 to reach your doctor's medical assistant. If no one answers, please leave a voicemail as directed and we will return your call as soon as possible. Messages left after 4 pm will be answered the following business day.   You may also send us   a message via MyChart. We typically respond to MyChart messages within 1-2 business days.  For prescription refills, please ask your pharmacy to contact our office. Our fax number is [phone].  If you have an urgent issue when the clinic is closed that cannot wait until the next business day, you can page your doctor at the number below.    Please note that while we do our best to be available for urgent issues outside of office hours, we are not available 24/7.   If you have an urgent issue and are unable to reach us, you may choose to seek medical care at your  doctor's office, retail clinic, urgent care center, or emergency room.  If you have a medical emergency, please immediately call 911 or go to the emergency department.  Pager Numbers  - Dr. Kowalski: [phone]  - Dr. Moye: [phone]  - Dr. Stewart: [phone]  In the event of inclement weather, please call our main line at [phone] for an update on the status of any delays or closures.  Dermatology Medication Tips: Please keep the boxes that topical medications come in in order to help keep track of the instructions about where and how to use these. Pharmacies typically print the medication instructions only on the boxes and not directly on the medication tubes.   If your medication is too expensive, please contact our office at [phone] option 4 or send us a message through MyChart.   We are unable to tell what your co-pay for medications will be in advance as this is different depending on your insurance coverage. However, we may be able to find a substitute medication at lower cost or fill out paperwork to get insurance to cover a needed medication.   If a prior authorization is required to get your medication covered by your insurance company, please allow us 1-2 business days to complete this process.  Drug prices often vary depending on where the prescription is filled and some pharmacies may offer cheaper prices.  The website www.goodrx.com contains coupons for medications through different pharmacies. The prices here do not account for what the cost may be with help from insurance (it may be cheaper with your insurance), but the website can give you the price if you did not use any insurance.  - You can print the associated coupon and take it with your prescription to the pharmacy.  - You may also stop by our office during regular business hours and pick up a GoodRx coupon card.  - If you need your prescription sent electronically to a different pharmacy, notify  our office through [facility] MyChart or by phone at [phone] option 4.     Si Usted Necesita Algo Despus de Su Visita  Tambin puede enviarnos un mensaje a travs de MyChart. Por lo general respondemos a los mensajes de MyChart en el transcurso de 1 a 2 das hbiles.  Para renovar recetas, por favor pida a su farmacia que se ponga en contacto con nuestra oficina. Nuestro nmero de fax es el [phone].  Si tiene un asunto urgente cuando la clnica est cerrada y que no puede esperar hasta el siguiente da hbil, puede llamar/localizar a su doctor(a) al nmero que aparece a continuacin.   Por favor, tenga en cuenta que aunque hacemos todo lo posible para estar disponibles para asuntos urgentes fuera del horario de oficina, no estamos disponibles las 24 horas del da, los 7 das de la semana.   Si tiene un problema   urgente y no puede comunicarse con nosotros, puede optar por buscar atencin mdica  en el consultorio de su doctor(a), en una clnica privada, en un centro de atencin urgente o en una sala de emergencias.  Si tiene una emergencia mdica, por favor llame inmediatamente al 911 o vaya a la sala de emergencias.  Nmeros de bper  - Dr. Kowalski: [phone]  - Dra. Moye: [phone]  - Dra. Stewart: [phone]  En caso de inclemencias del tiempo, por favor llame a nuestra lnea principal al [phone] para una actualizacin sobre el estado de cualquier retraso o cierre.  Consejos para la medicacin en dermatologa: Por favor, guarde las cajas en las que vienen los medicamentos de uso tpico para ayudarle a seguir las instrucciones sobre dnde y cmo usarlos. Las farmacias generalmente imprimen las instrucciones del medicamento slo en las cajas y no directamente en los tubos del medicamento.   Si su medicamento es muy caro, por favor, pngase en contacto con nuestra oficina llamando al [phone] y presione la opcin 4 o envenos un mensaje a travs de  MyChart.   No podemos decirle cul ser su copago por los medicamentos por adelantado ya que esto es diferente dependiendo de la cobertura de su seguro. Sin embargo, es posible que podamos encontrar un medicamento sustituto a menor costo o llenar un formulario para que el seguro cubra el medicamento que se considera necesario.   Si se requiere una autorizacin previa para que su compaa de seguros cubra su medicamento, por favor permtanos de 1 a 2 das hbiles para completar este proceso.  Los precios de los medicamentos varan con frecuencia dependiendo del lugar de dnde se surte la receta y alguna farmacias pueden ofrecer precios ms baratos.  El sitio web www.goodrx.com tiene cupones para medicamentos de diferentes farmacias. Los precios aqu no tienen en cuenta lo que podra costar con la ayuda del seguro (puede ser ms barato con su seguro), pero el sitio web puede darle el precio si no utiliz ningn seguro.  - Puede imprimir el cupn correspondiente y llevarlo con su receta a la farmacia.  - Tambin puede pasar por nuestra oficina durante el horario de atencin regular y recoger una tarjeta de cupones de GoodRx.  - Si necesita que su receta se enve electrnicamente a una farmacia diferente, informe a nuestra oficina a travs de MyChart de [place] o por telfono llamando al [phone] y presione la opcin 4.  

## 2022-04-16 NOTE — Progress Notes (Signed)
Follow-Up Visit   Subjective  Randall Cole is a 81 y.o. male who presents for the following: Actinic Keratosis (9 month follow up. Scalp. Tx with LN2. Hx of PDT Tx 06/2020) and Annual Exam (HxSCC, HxAKs. C/O area on neck and right posterior knee). The patient has spots, moles and lesions to be evaluated, some may be new or changing and the patient has concerns that these could be cancer.  The following portions of the chart were reviewed this encounter and updated as appropriate:  Tobacco  Allergies  Meds  Problems  Med Hx  Surg Hx  Fam Hx     Review of Systems: No other skin or systemic complaints except as noted in HPI or Assessment and Plan.  Objective  Well appearing patient in no apparent distress; mood and affect are within normal limits.  A focused examination was performed including head, including the scalp, face, neck, nose, ears, eyelids, and lips. Relevant physical exam findings are noted in the Assessment and Plan.  right neck x1, right post thigh x1, left calf x1 (3) Erythematous keratotic or waxy stuck-on papule or plaque.  face, ears, scalp x17 (17) Erythematous thin papules/macules with gritty scale.   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  - Call if any new or changing lesions are noted between office visits  Lentigines - Scattered tan macules - Due to sun exposure - Benign-appearing, observe - Recommend daily broad spectrum sunscreen SPF 30+ to sun-exposed areas, reapply every 2 hours as needed. - Call for any changes  Seborrheic Keratoses - Stuck-on, waxy, tan-brown papules and/or plaques  - Benign-appearing - Discussed benign etiology and  prognosis. - Observe - Call for any changes  Melanocytic Nevi - Tan-brown and/or pink-flesh-colored symmetric macules and papules - Benign appearing on exam today - Observation - Call clinic for new or changing moles - Recommend daily use of broad spectrum spf 30+ sunscreen to sun-exposed areas.   Hemangiomas - Red papules - Discussed benign nature - Observe - Call for any changes  Actinic Damage - Chronic condition, secondary to cumulative UV/sun exposure - diffuse scaly erythematous macules with underlying dyspigmentation - Recommend daily broad spectrum sunscreen SPF 30+ to sun-exposed areas, reapply every 2 hours as needed.  - Staying in the shade or wearing long sleeves, sun glasses (UVA+UVB protection) and wide brim hats (4-inch brim around the entire circumference of the hat) are also recommended for sun protection.  - Call for new or changing lesions.  Skin cancer screening performed today.  Inflamed seborrheic keratosis (3) right neck x1, right post thigh x1, left calf x1 Symptomatic, irritating, patient would like treated. Destruction of lesion - right neck x1, right post thigh x1, left calf x1 Complexity: simple   Destruction method: cryotherapy   Informed consent: discussed and consent obtained   Timeout:  patient name, date of birth, surgical site, and procedure verified Lesion destroyed using liquid nitrogen: Yes   Region frozen until ice ball extended beyond lesion: Yes   Outcome: patient tolerated procedure well with no complications   Post-procedure details: wound care instructions given   Additional details:  Prior to procedure, discussed risks of blister formation, small wound, skin dyspigmentation, or rare scar  following cryotherapy. Recommend Vaseline ointment to treated areas while healing.   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 They are chronic with expected duration over 1 year. A portion of actinic keratoses will progress to squamous cell carcinoma of the skin. It is not possible to reliably predict which spots will progress to skin cancer and so treatment is recommended to prevent development of skin cancer.  Recommend daily broad spectrum sunscreen SPF 30+ to sun-exposed areas, reapply every 2 hours as needed.  Recommend staying in the shade or wearing long sleeves, sun glasses (UVA+UVB protection) and wide brim hats (4-inch brim around the entire circumference of the hat). Call for new or changing lesions.  Destruction of lesion - face, ears, scalp x17 Complexity: simple   Destruction method: cryotherapy   Informed consent: discussed and consent obtained   Timeout:  patient name, date of birth, surgical site, and procedure verified Lesion destroyed using liquid nitrogen: Yes   Region frozen until ice ball extended beyond lesion: Yes   Outcome: patient tolerated procedure well with no complications   Post-procedure details: wound care instructions given   Additional details:  Prior to procedure, discussed risks of blister formation, small wound, skin dyspigmentation, or rare scar following cryotherapy. Recommend Vaseline ointment to treated areas while healing.   Related Medications fluorouracil (EFUDEX) 5 % cream Apply topically 2 (two) times daily. 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 Nails did not clear.  Chronic and persistent condition with duration or expected duration over one year. Condition is bothersome/symptomatic for patient. Currently flared. Discussed oral and topical therapy options.  Patient declines at this time.  We we will reevaluate at next visit.  Return in about 9 months (around 01/16/2023) for AK Follow Up .  I, Emelia Salisbury, CMA, am acting as scribe for Sarina Ser, MD. Documentation: I have reviewed the  above documentation for accuracy and completeness, and I agree with the above.  Sarina Ser, MD

## 2022-04-29 ENCOUNTER — Encounter: Payer: Self-pay | Admitting: Dermatology

## 2022-04-30 DIAGNOSIS — H353221 Exudative age-related macular degeneration, left eye, with active choroidal neovascularization: Secondary | ICD-10-CM | POA: Diagnosis not present

## 2023-02-12 ENCOUNTER — Other Ambulatory Visit: Payer: Self-pay | Admitting: Gastroenterology

## 2023-02-14 ENCOUNTER — Ambulatory Visit: Payer: Medicare HMO | Admitting: Dermatology

## 2023-02-14 DIAGNOSIS — Z79899 Other long term (current) drug therapy: Secondary | ICD-10-CM

## 2023-02-14 DIAGNOSIS — L603 Nail dystrophy: Secondary | ICD-10-CM | POA: Diagnosis not present

## 2023-02-14 DIAGNOSIS — Z5111 Encounter for antineoplastic chemotherapy: Secondary | ICD-10-CM | POA: Diagnosis not present

## 2023-02-14 DIAGNOSIS — L578 Other skin changes due to chronic exposure to nonionizing radiation: Secondary | ICD-10-CM | POA: Diagnosis not present

## 2023-02-14 DIAGNOSIS — L820 Inflamed seborrheic keratosis: Secondary | ICD-10-CM

## 2023-02-14 DIAGNOSIS — L570 Actinic keratosis: Secondary | ICD-10-CM

## 2023-02-14 DIAGNOSIS — Z7189 Other specified counseling: Secondary | ICD-10-CM

## 2023-02-14 DIAGNOSIS — W908XXA Exposure to other nonionizing radiation, initial encounter: Secondary | ICD-10-CM

## 2023-02-14 DIAGNOSIS — Z872 Personal history of diseases of the skin and subcutaneous tissue: Secondary | ICD-10-CM

## 2023-02-14 DIAGNOSIS — L821 Other seborrheic keratosis: Secondary | ICD-10-CM

## 2023-02-14 NOTE — Progress Notes (Signed)
Follow-Up Visit   Subjective  Randall Cole is a 82 y.o. male who presents for the following: here for 62 month ak and isk follow up. reports some toenail fungus  Right big toe, Hx of aks, used 72f/u cream and blue light pdt in past.  The patient has spots, moles and lesions to be evaluated, some may be new or changing and the patient may have concern these could be cancer.   The following portions of the chart were reviewed this encounter and updated as appropriate: medications, allergies, medical history  Review of Systems:  No other skin or systemic complaints except as noted in HPI or Assessment and Plan.  Objective  Well appearing patient in no apparent distress; mood and affect are within normal limits.   A focused examination was performed of the following areas: Scalp, chest, back, face, arms, ears, hands, right great toe  Relevant exam findings are noted in the Assessment and Plan.  scalp/face x 18, right hand x 1 (19) Erythematous thin papules/macules with gritty scale.   right abdomen x 1, right neck x 1 (2) Erythematous stuck-on, waxy papule or plaque    Assessment & Plan   Onychodystrophy Toenails  Exam: Toenail dystrophy  Has treated with oral Terbinafine in the past. Nails did not clear.  Chronic and persistent condition with duration or expected duration over one year. Condition is bothersome/symptomatic for patient. Currently flared.  Discussed toenail clippings could be sent to [facility] for further testing and to determine best treatments.   Patient's toenail at right great toe too short, unable to obtain nail clipping today.   Patient advised to bring in nail clipping to office in sterile specimen cup provided to patient today.    ACTINIC DAMAGE WITH PRECANCEROUS ACTINIC KERATOSES Counseling for Topical Chemotherapy Management: Patient exhibits: - Severe, confluent actinic changes with pre-cancerous actinic keratoses that is secondary to cumulative  UV radiation exposure over time - Condition that is severe; chronic, not at goal. - diffuse scaly erythematous macules and papules with underlying dyspigmentation - Discussed Prescription "Field Treatment" topical Chemotherapy for Severe, Chronic Confluent Actinic Changes with Pre-Cancerous Actinic Keratoses Field treatment involves treatment of an entire area of skin that has confluent Actinic Changes (Sun/ Ultraviolet light damage) and PreCancerous Actinic Keratoses by method of PhotoDynamic Therapy (PDT) and/or prescription Topical Chemotherapy agents such as 5-fluorouracil, 5-fluorouracil/calcipotriene, and/or imiquimod.  The purpose is to decrease the number of clinically evident and subclinical PreCancerous lesions to prevent progression to development of skin cancer by chemically destroying early precancer changes that may or may not be visible.  It has been shown to reduce the risk of developing skin cancer in the treated area. As a result of treatment, redness, scaling, crusting, and open sores may occur during treatment course. One or more than one of these methods may be used and may have to be used several times to control, suppress and eliminate the PreCancerous changes. Discussed treatment course, expected reaction, and possible side effects. - Recommend daily broad spectrum sunscreen SPF 30+ to sun-exposed areas, reapply every 2 hours as needed.  - Staying in the shade or wearing long sleeves, sun glasses (UVA+UVB protection) and wide brim hats (4-inch brim around the entire circumference of the hat) are also recommended. - Call for new or changing lesions.  Start in 1 month   - Start 5-fluorouracil/calcipotriene cream twice a day for 10 days to affected areas including scalp. Prescription sent to [facility]. Patient advised they will  receive an email to purchase the medication online and have it sent to their home. Patient provided with handout reviewing treatment  course and side effects and advised to call or message [place] on MyChart with any concerns.  Reviewed course of treatment and expected reaction.  Patient advised to expect inflammation and crusting and advised that erosions are possible.  Patient advised to be diligent with sun protection during and after treatment. Counseled to keep medication out of reach of children and pets.   Actinic keratosis (19) scalp/face x 18, right hand x 1  In 1 month   - Start 5-fluorouracil/calcipotriene cream twice a day for 10 days to affected areas including scalp. Prescription sent to [facility]. Patient advised they will receive an email to purchase the medication online and have it sent to their home. Patient provided with handout reviewing treatment course and side effects and advised to call or message [place] on MyChart with any concerns.  Reviewed course of treatment and expected reaction.  Patient advised to expect inflammation and crusting and advised that erosions are possible.  Patient advised to be diligent with sun protection during and after treatment. Counseled to keep medication out of reach of children and pets.   Actinic keratoses are precancerous spots that appear secondary to cumulative UV radiation exposure/sun exposure over time. They are chronic with expected duration over 1 year. A portion of actinic keratoses will progress to squamous cell carcinoma of the skin. It is not possible to reliably predict which spots will progress to skin cancer and so treatment is recommended to prevent development of skin cancer.  Recommend daily broad spectrum sunscreen SPF 30+ to sun-exposed areas, reapply every 2 hours as needed.  Recommend staying in the shade or wearing long sleeves, sun glasses (UVA+UVB protection) and wide brim hats (4-inch brim around the entire circumference of the hat). Call for new or changing lesions.  Destruction of lesion - scalp/face x 18, right hand x 1  (19) Complexity: simple   Destruction method: cryotherapy   Informed consent: discussed and consent obtained   Timeout:  patient name, date of birth, surgical site, and procedure verified Lesion destroyed using liquid nitrogen: Yes   Region frozen until ice ball extended beyond lesion: Yes   Outcome: patient tolerated procedure well with no complications   Post-procedure details: wound care instructions given    Inflamed seborrheic keratosis (2) right abdomen x 1, right neck x 1  Symptomatic, irritating, patient would like treated.  Destruction of lesion - right abdomen x 1, right neck x 1 (2) Complexity: simple   Destruction method: cryotherapy   Informed consent: discussed and consent obtained   Timeout:  patient name, date of birth, surgical site, and procedure verified Lesion destroyed using liquid nitrogen: Yes   Region frozen until ice ball extended beyond lesion: Yes   Outcome: patient tolerated procedure well with no complications   Post-procedure details: wound care instructions given      Return for 6 month follow up ak and toenails .  IAsher Muir, CMA, am acting as scribe for Armida Sans, MD.   Documentation: I have reviewed the above documentation for accuracy and completeness, and I agree with the above.  Armida Sans, MD

## 2023-02-14 NOTE — Patient Instructions (Addendum)
For toenail at right great toe  Bring in trimming for right great toenail in specimen cup provided.      Start in 1 month   - Start 5-fluorouracil/calcipotriene cream twice a day for 10 days to affected areas including top of scalp. Prescription sent to [facility]. Patient advised they will receive an email to purchase the medication online and have it sent to their home. Patient provided with handout reviewing treatment course and side effects and advised to call or message [place] on MyChart with any concerns.  Reviewed course of treatment and expected reaction.  Patient advised to expect inflammation and crusting and advised that erosions are possible.  Patient advised to be diligent with sun protection during and after treatment. Counseled to keep medication out of reach of children and pets.  Instructions for [facility]  One or more of your medications was sent to the [facility]. You will receive an email from them and can purchase the medicine through that link. It will then be mailed to your home at the address you confirmed. If for any reason you do not receive an email from them, please check your spam folder. If you still do not find the email, please let us know. [facility] phone number is [phone].      5-Fluorouracil/Calcipotriene Patient Education   Actinic keratoses are the dry, red scaly spots on the skin caused by sun damage. A portion of these spots can turn into skin cancer with time, and treating them can help prevent development of skin cancer.   Treatment of these spots requires removal of the defective skin cells. There are various ways to remove actinic keratoses, including freezing with liquid nitrogen, treatment with creams, or treatment with a blue light procedure in the office.   5-fluorouracil cream is a topical cream used to treat actinic keratoses. It works by interfering  with the growth of abnormal fast-growing skin cells, such as actinic keratoses. These cells peel off and are replaced by healthy ones.   5-fluorouracil/calcipotriene is a combination of the 5-fluorouracil cream with a vitamin D analog cream called calcipotriene. The calcipotriene alone does not treat actinic keratoses. However, when it is combined with 5-fluorouracil, it helps the 5-fluorouracil treat the actinic keratoses much faster so that the same results can be achieved with a much shorter treatment time.  INSTRUCTIONS FOR 5-FLUOROURACIL/CALCIPOTRIENE CREAM:   5-fluorouracil/calcipotriene cream typically only needs to be used for 4-7 days. A thin layer should be applied twice a day to the treatment areas recommended by your physician.   If your physician prescribed you separate tubes of 5-fluourouracil and calcipotriene, apply a thin layer of 5-fluorouracil followed by a thin layer of calcipotriene.   Avoid contact with your eyes, nostrils, and mouth. Do not use 5-fluorouracil/calcipotriene cream on infected or open wounds.   You will develop redness, irritation and some crusting at areas where you have pre-cancer damage/actinic keratoses. IF YOU DEVELOP PAIN, BLEEDING, OR SIGNIFICANT CRUSTING, STOP THE TREATMENT EARLY - you have already gotten a good response and the actinic keratoses should clear up well.  Wash your hands after applying 5-fluorouracil 5% cream on your skin.   A moisturizer or sunscreen with a minimum SPF 30 should be applied each morning.   Once you have finished the treatment, you can apply a thin layer of Vaseline twice a day to irritated areas to soothe and calm the areas more quickly. If you experience significant discomfort, contact  your physician.  For some patients it is necessary to repeat the treatment for best results.  SIDE EFFECTS: When using 5-fluorouracil/calcipotriene cream, you may have mild irritation, such as redness, dryness, swelling, or a mild  burning sensation. This usually resolves within 2 weeks. The more actinic keratoses you have, the more redness and inflammation you can expect during treatment. Eye irritation has been reported rarely. If this occurs, please let us know.  If you have any trouble using this cream, please call the office. If you have any other questions about this information, please do not hesitate to ask me before you leave the office.     Actinic keratoses are precancerous spots that appear secondary to cumulative UV radiation exposure/sun exposure over time. They are chronic with expected duration over 1 year. A portion of actinic keratoses will progress to squamous cell carcinoma of the skin. It is not possible to reliably predict which spots will progress to skin cancer and so treatment is recommended to prevent development of skin cancer.  Recommend daily broad spectrum sunscreen SPF 30+ to sun-exposed areas, reapply every 2 hours as needed.  Recommend staying in the shade or wearing long sleeves, sun glasses (UVA+UVB protection) and wide brim hats (4-inch brim around the entire circumference of the hat). Call for new or changing lesions.    Cryotherapy Aftercare  Wash gently with soap and water everyday.   Apply Vaseline and Band-Aid daily until healed.        Due to recent changes in healthcare laws, you may see results of your pathology and/or laboratory studies on MyChart before the doctors have had a chance to review them. We understand that in some cases there may be results that are confusing or concerning to you. Please understand that not all results are received at the same time and often the doctors may need to interpret multiple results in order to provide you with the best plan of care or course of treatment. Therefore, we ask that you please give [place] 2 business days to thoroughly review all your results before contacting the office for clarification. Should we see a critical lab result, you  will be contacted sooner.   If You Need Anything After Your Visit  If you have any questions or concerns for your doctor, please call our main line at [phone] and press option 4 to reach your doctor's medical assistant. If no one answers, please leave a voicemail as directed and we will return your call as soon as possible. Messages left after 4 pm will be answered the following business day.   You may also send [place] a message via MyChart. We typically respond to MyChart messages within 1-2 business days.  For prescription refills, please ask your pharmacy to contact our office. Our fax number is [phone].  If you have an urgent issue when the clinic is closed that cannot wait until the next business day, you can page your doctor at the number below.    Please note that while we do our best to be available for urgent issues outside of office hours, we are not available 24/7.   If you have an urgent issue and are unable to reach [place], you may choose to seek medical care at your doctor's office, retail clinic, urgent care center, or emergency room.  If you have a medical emergency, please immediately call 911 or go to the emergency department.  Pager Numbers  - Dr. Gwen Pounds: [phone]  - Dr. Roseanne Reno: [phone]  -  Dr. Katrinka Blazing: [phone]   In the event of inclement weather, please call our main line at [phone] for an update on the status of any delays or closures.  Dermatology Medication Tips: Please keep the boxes that topical medications come in in order to help keep track of the instructions about where and how to use these. Pharmacies typically print the medication instructions only on the boxes and not directly on the medication tubes.   If your medication is too expensive, please contact our office at [phone] option 4 or send [place] a message through MyChart.   We are unable to tell what your co-pay for medications will be in advance as this is different  depending on your insurance coverage. However, we may be able to find a substitute medication at lower cost or fill out paperwork to get insurance to cover a needed medication.   If a prior authorization is required to get your medication covered by your insurance company, please allow [place] 1-2 business days to complete this process.  Drug prices often vary depending on where the prescription is filled and some pharmacies may offer cheaper prices.  The website www.goodrx.com contains coupons for medications through different pharmacies. The prices here do not account for what the cost may be with help from insurance (it may be cheaper with your insurance), but the website can give you the price if you did not use any insurance.  - You can print the associated coupon and take it with your prescription to the pharmacy.  - You may also stop by our office during regular business hours and pick up a GoodRx coupon card.  - If you need your prescription sent electronically to a different pharmacy, notify our office through [facility] or by phone at [phone] option 4.     Si Usted Necesita Algo Despus de Su Visita  Tambin puede enviarnos un mensaje a travs de Clinical cytogeneticist. Por lo general respondemos a los mensajes de MyChart en el transcurso de 1 a 2 das hbiles.  Para renovar recetas, por favor pida a su farmacia que se ponga en contacto con nuestra oficina. Annie Sable de fax es [place] [phone].  Si tiene un asunto urgente cuando la clnica est cerrada y que no puede esperar hasta el siguiente da hbil, puede llamar/localizar a su doctor(a) al nmero que aparece a continuacin.   Por favor, tenga en cuenta que aunque hacemos todo lo posible para estar disponibles para asuntos urgentes fuera del horario de [place], no estamos disponibles las 24 horas del da, los 7 [address]  [address] de la [place].   Si tiene un problema urgente y no puede comunicarse con nosotros, puede optar por buscar atencin  mdica  en el consultorio de su doctor(a), en una clnica privada, en un centro de atencin urgente o en una sala de emergencias.  Si tiene Engineer, drilling, por favor llame inmediatamente al 911 o vaya a la sala de emergencias.  Nmeros de bper  - Dr. Gwen Pounds: [phone]  - Dra. Roseanne Reno: [phone]  - Dr. Katrinka Blazing: [phone]   En caso de inclemencias del tiempo, por favor llame a Lacy Duverney principal al [phone] para una actualizacin sobre el [place] de cualquier retraso o cierre.  Consejos para la medicacin en dermatologa: Por favor, guarde las cajas en las que vienen los medicamentos de uso tpico para ayudarle a seguir las instrucciones sobre dnde y cmo usarlos. Las farmacias generalmente imprimen las instrucciones del medicamento slo en las cajas y no directamente en  los tubos del medicamento.   Si su medicamento es muy caro, por favor, pngase en contacto con Rolm Gala llamando al [phone] y presione la opcin 4 o envenos un mensaje a travs de Clinical cytogeneticist.   No podemos decirle cul ser su copago por los medicamentos por adelantado ya que esto es diferente dependiendo de la cobertura de su seguro. Sin embargo, es posible que podamos encontrar un medicamento sustituto a Audiological scientist un formulario para que el seguro cubra el medicamento que se considera necesario.   Si se requiere una autorizacin previa para que su compaa de seguros [place] su medicamento, por favor permtanos de 1 a 2 das hbiles para completar [address]  Los precios de los medicamentos varan con frecuencia dependiendo del Environmental consultant de dnde se surte la receta y alguna farmacias pueden ofrecer precios ms baratos.  El sitio web www.goodrx.com tiene cupones para medicamentos de Health and safety inspector. Los precios aqu no tienen en cuenta lo que podra costar con la ayuda del seguro (puede ser ms barato con su seguro), pero el sitio web puede darle el precio si no utiliz Producer, television/film/video.  - Puede imprimir el cupn correspondiente y llevarlo con su receta a la farmacia.  - Tambin puede pasar por nuestra oficina durante el horario de atencin regular y Education officer, museum una tarjeta de cupones de GoodRx.  - Si necesita que su receta se enve electrnicamente a una farmacia diferente, informe a nuestra oficina a travs de MyChart de [place] o por telfono llamando al [phone] y presione la opcin 4.

## 2023-02-16 ENCOUNTER — Encounter: Payer: Self-pay | Admitting: Dermatology

## 2023-04-21 ENCOUNTER — Other Ambulatory Visit: Payer: Self-pay | Admitting: Gastroenterology

## 2023-05-17 ENCOUNTER — Other Ambulatory Visit: Payer: Self-pay | Admitting: Gastroenterology

## 2023-09-12 ENCOUNTER — Telehealth: Payer: Self-pay

## 2023-09-12 NOTE — Telephone Encounter (Addendum)
 Tried calling patient regarding results. No answer. LM for patient to return call.   ----- Message from Celine Collard sent at 09/12/2023  4:48 PM EDT ----- Molecular studies on toenail clippings from 03/13/2023 showed NO PATHOGENS. 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  This nail changes may be due to trauma or other reasons (psoriasis?).   I recommend no treatment and we will evaluate and discuss next visit.  Please call and advise pt.and document.  You may copy and paste above.

## 2023-09-16 ENCOUNTER — Telehealth: Payer: Self-pay

## 2023-09-16 NOTE — Telephone Encounter (Signed)
 Patient called over weekend and left message to return call. Called patient back and discussed results with him. He verbalized understanding and will discuss with Dr. Bary Likes at his next follow up.     Message from Celine Collard sent at 09/12/2023  4:48 PM EDT ----- Molecular studies on toenail clippings from 03/13/2023 showed NO PATHOGENS. 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  This nail changes may be due to trauma or other reasons (psoriasis?).   I recommend no treatment and we will evaluate and discuss next visit.   Please call and advise pt.and document.  You may copy and paste above.

## 2023-09-25 ENCOUNTER — Encounter: Payer: Self-pay | Admitting: Dermatology

## 2023-10-08 ENCOUNTER — Ambulatory Visit: Admitting: Dermatology

## 2023-10-09 ENCOUNTER — Ambulatory Visit: Payer: PPO | Admitting: Dermatology

## 2023-11-12 ENCOUNTER — Ambulatory Visit: Admitting: Dermatology

## 2023-11-12 DIAGNOSIS — Z79899 Other long term (current) drug therapy: Secondary | ICD-10-CM

## 2023-11-12 DIAGNOSIS — Z5111 Encounter for antineoplastic chemotherapy: Secondary | ICD-10-CM

## 2023-11-12 DIAGNOSIS — D229 Melanocytic nevi, unspecified: Secondary | ICD-10-CM

## 2023-11-12 DIAGNOSIS — L814 Other melanin hyperpigmentation: Secondary | ICD-10-CM

## 2023-11-12 DIAGNOSIS — L570 Actinic keratosis: Secondary | ICD-10-CM

## 2023-11-12 DIAGNOSIS — Z1283 Encounter for screening for malignant neoplasm of skin: Secondary | ICD-10-CM

## 2023-11-12 DIAGNOSIS — W908XXA Exposure to other nonionizing radiation, initial encounter: Secondary | ICD-10-CM | POA: Diagnosis not present

## 2023-11-12 DIAGNOSIS — D1801 Hemangioma of skin and subcutaneous tissue: Secondary | ICD-10-CM

## 2023-11-12 DIAGNOSIS — L603 Nail dystrophy: Secondary | ICD-10-CM | POA: Diagnosis not present

## 2023-11-12 DIAGNOSIS — L578 Other skin changes due to chronic exposure to nonionizing radiation: Secondary | ICD-10-CM

## 2023-11-12 DIAGNOSIS — R238 Other skin changes: Secondary | ICD-10-CM

## 2023-11-12 DIAGNOSIS — Z7189 Other specified counseling: Secondary | ICD-10-CM

## 2023-11-12 DIAGNOSIS — L821 Other seborrheic keratosis: Secondary | ICD-10-CM

## 2023-11-12 MED ORDER — FLUOROURACIL 5 % EX CREA: TOPICAL_CREAM | CUTANEOUS | 0 refills | Status: AC

## 2023-11-12 NOTE — Patient Instructions (Addendum)
 Instructions for [facility]  One or more of your medications was sent to the [facility]. You will receive an email from them and can purchase the medicine through that link. It will then be mailed to your home at the address you confirmed. If for any reason you do not receive an email from them, please check your spam folder. If you still do not find the email, please let us know. [facility] phone number is [phone].       Due to recent changes in healthcare laws, you may see results of your pathology and/or laboratory studies on MyChart before the doctors have had a chance to review them. We understand that in some cases there may be results that are confusing or concerning to you. Please understand that not all results are received at the same time and often the doctors may need to interpret multiple results in order to provide you with the best plan of care or course of treatment. Therefore, we ask that you please give [place] 2 business days to thoroughly review all your results before contacting the office for clarification. Should we see a critical lab result, you will be contacted sooner.   If You Need Anything After Your Visit  If you have any questions or concerns for your doctor, please call our main line at [phone] and press option 4 to reach your doctor's medical assistant. If no one answers, please leave a voicemail as directed and we will return your call as soon as possible. Messages left after 4 pm will be answered the following business day.   You may also send [place] a message via MyChart. We typically respond to MyChart messages within 1-2 business days.  For prescription refills, please ask your pharmacy to contact our office. Our fax number is [phone].  If you have an urgent issue when the clinic is closed that cannot wait until the next business day, you can page your doctor at the number below.    Please note  that while we do our best to be available for urgent issues outside of office hours, we are not available 24/7.   If you have an urgent issue and are unable to reach [place], you may choose to seek medical care at your doctor's office, retail clinic, urgent care center, or emergency room.  If you have a medical emergency, please immediately call 911 or go to the emergency department.  Pager Numbers  - Dr. Gwen Pounds: [phone]  - Dr. Roseanne Reno: [phone]  - Dr. Katrinka Blazing: [phone]   In the event of inclement weather, please call our main line at [phone] for an update on the status of any delays or closures.  Dermatology Medication Tips: Please keep the boxes that topical medications come in in order to help keep track of the instructions about where and how to use these. Pharmacies typically print the medication instructions only on the boxes and not directly on the medication tubes.   If your medication is too expensive, please contact our office at [phone] option 4 or send [place] a message through MyChart.   We are unable to tell what your co-pay for medications will be in advance as this is different depending on your insurance coverage. However, we may be able to find a substitute medication at lower cost or fill out paperwork to get insurance to cover a needed medication.   If a prior authorization is required to get your medication covered by your  insurance company, please allow [place] 1-2 business days to complete this process.  Drug prices often vary depending on where the prescription is filled and some pharmacies may offer cheaper prices.  The website www.goodrx.com contains coupons for medications through different pharmacies. The prices here do not account for what the cost may be with help from insurance (it may be cheaper with your insurance), but the website can give you the price if you did not use any insurance.  - You can print the associated coupon and take it with your  prescription to the pharmacy.  - You may also stop by our office during regular business hours and pick up a GoodRx coupon card.  - If you need your prescription sent electronically to a different pharmacy, notify our office through [facility] or by phone at [phone] option 4.     Si Usted Necesita Algo Despus de Su Visita  Tambin puede enviarnos un mensaje a travs de Clinical cytogeneticist. Por lo general respondemos a los mensajes de MyChart en el transcurso de 1 a 2 das hbiles.  Para renovar recetas, por favor pida a su farmacia que se ponga en contacto con nuestra oficina. Annie Sable de fax es [place] [phone].  Si tiene un asunto urgente cuando la clnica est cerrada y que no puede esperar hasta el siguiente da hbil, puede llamar/localizar a su doctor(a) al nmero que aparece a continuacin.   Por favor, tenga en cuenta que aunque hacemos todo lo posible para estar disponibles para asuntos urgentes fuera del horario de [place], no estamos disponibles las 24 horas del da, los 7 [address]  [address] de la [place].   Si tiene un problema urgente y no puede comunicarse con nosotros, puede optar por buscar atencin mdica  en el consultorio de su doctor(a), en una clnica privada, en un centro de atencin urgente o en una sala de emergencias.  Si tiene Engineer, drilling, por favor llame inmediatamente al 911 o vaya a la sala de emergencias.  Nmeros de bper  - Dr. Gwen Pounds: [phone]  - Dra. Roseanne Reno: [phone]  - Dr. Katrinka Blazing: [phone]   En caso de inclemencias del tiempo, por favor llame a Lacy Duverney principal al [phone] para una actualizacin sobre el [place] de cualquier retraso o cierre.  Consejos para la medicacin en dermatologa: Por favor, guarde las cajas en las que vienen los medicamentos de uso tpico para ayudarle a seguir las instrucciones sobre dnde y cmo usarlos. Las farmacias generalmente imprimen las instrucciones del medicamento slo en las cajas y no  directamente en los tubos del [place].   Si su medicamento es muy caro, por favor, pngase en contacto con Rolm Gala llamando al [phone] y presione la opcin 4 o envenos un mensaje a travs de Clinical cytogeneticist.   No podemos decirle cul ser su copago por los medicamentos por adelantado ya que esto es diferente dependiendo de la cobertura de su seguro. Sin embargo, es posible que podamos encontrar un medicamento sustituto a Audiological scientist un formulario para que el seguro cubra el medicamento que se considera necesario.   Si se requiere una autorizacin previa para que su compaa de seguros [place] su medicamento, por favor permtanos de 1 a 2 das hbiles para completar [address]  Los precios de los medicamentos varan con frecuencia dependiendo del Environmental consultant de dnde se surte la receta y alguna farmacias pueden ofrecer precios ms baratos.  El sitio web www.goodrx.com tiene cupones para medicamentos de Health and safety inspector. Los precios aqu no tienen  en cuenta lo que podra costar con la ayuda del seguro (puede ser ms barato con su seguro), pero el sitio web puede darle el precio si no Visual merchandiser.  - Puede imprimir el cupn correspondiente y llevarlo con su receta a la farmacia.  - Tambin puede pasar por nuestra oficina durante el horario de atencin regular y Education officer, museum una tarjeta de cupones de GoodRx.  - Si necesita que su receta se enve electrnicamente a una farmacia diferente, informe a nuestra oficina a travs de MyChart de [place] o por telfono llamando al [phone] y presione la opcin 4.

## 2023-11-12 NOTE — Progress Notes (Signed)
 Follow-Up Visit   Subjective  Randall Cole is a 83 y.o. male who presents for the following: UBSE - recheck for Aks, recheck toenails and discuss treatment options. Pt did not start topical 5FU/Calcipotriene due to not being order it online.  The following portions of the chart were reviewed this encounter and updated as appropriate: medications, allergies, medical history  Review of Systems:  No other skin or systemic complaints except as noted in HPI or Assessment and Plan.  Objective  Well appearing patient in no apparent distress; mood and affect are within normal limits.   A focused examination was performed of the following areas: the face, scalp, arms, legs, back, chest, and hands   Relevant exam findings are noted in the Assessment and Plan.  forehead and scalp x 16 (16) Erythematous thin papules/macules with gritty scale.   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 They are chronic with expected duration over 1 year. A portion of actinic keratoses will progress to squamous cell carcinoma of the skin. It is not possible to reliably predict which spots will progress to skin cancer and so treatment is recommended to prevent development of skin cancer.  Recommend daily broad spectrum sunscreen SPF 30+ to sun-exposed areas, reapply every 2 hours as needed.  Recommend staying in the shade or wearing long sleeves, sun glasses (UVA+UVB protection) and wide brim hats (4-inch brim around the entire circumference of the hat). Call for new or changing lesions.  ACTINIC DAMAGE WITH PRECANCEROUS ACTINIC KERATOSES Counseling for Topical Chemotherapy Management: Patient exhibits: - Severe, confluent actinic changes with pre-cancerous actinic keratoses that is secondary to cumulative UV radiation exposure over time - Condition that is  severe; chronic, not at goal. - diffuse scaly erythematous macules and papules with underlying dyspigmentation - Discussed Prescription Field Treatment topical Chemotherapy for Severe, Chronic Confluent Actinic Changes with Pre-Cancerous Actinic Keratoses Field treatment involves treatment of an entire area of skin that has confluent Actinic Changes (Sun/ Ultraviolet light damage) and PreCancerous Actinic Keratoses by method of PhotoDynamic Therapy (PDT) and/or prescription Topical Chemotherapy agents such as 5-fluorouracil , 5-fluorouracil /calcipotriene, and/or imiquimod.  The purpose is to decrease the number of clinically evident and subclinical PreCancerous lesions to prevent progression to development of skin cancer by chemically destroying early precancer changes that may or may not be visible.  It has been shown to reduce the risk of developing skin cancer in the treated area. As a result of treatment, redness, scaling, crusting, and open sores may occur during treatment course. One or more than one of these methods may be used and may have to be used several times to control, suppress and eliminate the PreCancerous changes. Discussed treatment course, expected reaction, and possible side effects. - Recommend daily broad spectrum sunscreen SPF 30+ to sun-exposed areas, reapply every 2 hours as needed.  - Staying in the shade or wearing long sleeves, sun glasses (UVA+UVB protection) and wide brim hats (4-inch brim around the entire circumference of the hat) are also recommended. - Call for new or changing lesions. - Starting September 1st apply BID x 7 days to the forehead and frontal scalp. Destruction of lesion - forehead and scalp x 16 (16) Complexity: simple   Destruction method: cryotherapy   Informed consent: discussed and consent obtained   Timeout:  patient name, date of birth, surgical site, and procedure verified Lesion destroyed  using liquid nitrogen: Yes   Region frozen until ice  ball extended beyond lesion: Yes   Outcome: patient tolerated procedure well with no complications   Post-procedure details: wound care instructions given   AK (ACTINIC KERATOSIS)   Related Medications fluorouracil  (EFUDEX ) 5 % cream Starting September 1st apply to the forehead and frontal scalp BID x 7 days.  SEBORRHEIC KERATOSIS - Stuck-on, waxy, tan-brown papules and/or plaques  - Benign-appearing - Discussed benign etiology and prognosis. - Observe - Call for any changes  Thrombus vs cyst Exam: L med thigh at the groin 0.4 cm firm papule BB like papule, slightly deeper than SQ.   Benign appearing, not bothersome to patient. Recommend exam through PCP, if lesion becomes painful or grows in size recommend referral to general surgeon. Pt defers at this time since not bothersome.  LENTIGINES Exam: scattered tan macules Due to sun exposure Treatment Plan: Benign-appearing, observe. Recommend daily broad spectrum sunscreen SPF 30+ to sun-exposed areas, reapply every 2 hours as needed.  Call for any changes  HEMANGIOMA Exam: red papule(s) Discussed benign nature. Recommend observation. Call for changes.  MELANOCYTIC NEVI Exam: Tan-brown and/or pink-flesh-colored symmetric macules and papules Treatment Plan: Benign appearing on exam today. Recommend observation. Call clinic for new or changing moles. Recommend daily use of broad spectrum spf 30+ sunscreen to sun-exposed areas.   NAIL PROBLEM - molecular study negative for pathogens, trauma vs psoriasis, pt states no hx of psoriasis. Pt did try oral treatment in the past, but he did not experience any improvement.        Exam: dystrophic toenails  Treatment Plan: No treatment needed at this time.  Return in about 6 months (around 05/14/2024) for AK follow up.  [name] [name], CMA, am acting as scribe for Alm Rhyme, MD .   Documentation: I have reviewed the above documentation for accuracy and completeness, and I  agree with the above.  Alm Rhyme, MD

## 2023-11-19 ENCOUNTER — Encounter: Payer: Self-pay | Admitting: Dermatology

## 2023-11-21 ENCOUNTER — Other Ambulatory Visit: Payer: Self-pay | Admitting: Gastroenterology

## 2024-06-23 ENCOUNTER — Ambulatory Visit: Admitting: Dermatology
